# Patient Record
Sex: Female | Born: 1965 | Race: White | Hispanic: No
Health system: Midwestern US, Community
[De-identification: ages and names within clinical notes are randomized; demographics above are authoritative.]

## PROBLEM LIST (undated history)

## (undated) DIAGNOSIS — C801 Malignant (primary) neoplasm, unspecified: Secondary | ICD-10-CM

## (undated) DIAGNOSIS — G4730 Sleep apnea, unspecified: Secondary | ICD-10-CM

## (undated) DIAGNOSIS — I10 Essential (primary) hypertension: Secondary | ICD-10-CM

## (undated) DIAGNOSIS — R0600 Dyspnea, unspecified: Secondary | ICD-10-CM

## (undated) DIAGNOSIS — J189 Pneumonia, unspecified organism: Secondary | ICD-10-CM

## (undated) DIAGNOSIS — M1990 Unspecified osteoarthritis, unspecified site: Secondary | ICD-10-CM

## (undated) DIAGNOSIS — F419 Anxiety disorder, unspecified: Secondary | ICD-10-CM

## (undated) DIAGNOSIS — M25562 Pain in left knee: Secondary | ICD-10-CM

## (undated) DIAGNOSIS — Z96652 Presence of left artificial knee joint: Secondary | ICD-10-CM

## (undated) DIAGNOSIS — M1712 Unilateral primary osteoarthritis, left knee: Secondary | ICD-10-CM

## (undated) HISTORY — PX: THYROIDECTOMY: SHX17

## (undated) HISTORY — PX: ABDOMINAL HYSTERECTOMY: SHX81

## (undated) HISTORY — PX: KNEE SURGERY: SHX244

## (undated) HISTORY — PX: CHOLECYSTECTOMY: SHX55

## (undated) HISTORY — PX: REPLACEMENT TOTAL KNEE: SUR1224

---

## 2015-05-25 ENCOUNTER — Encounter: Admit: 2015-05-25

## 2015-05-25 ENCOUNTER — Inpatient Hospital Stay
Admit: 2015-05-25 | Discharge: 2015-05-25 | Disposition: A | Discharge status: Home / Self Care | Attending: Emergency Medicine

## 2015-05-25 DIAGNOSIS — R1010 Upper abdominal pain, unspecified: Secondary | ICD-10-CM

## 2015-05-25 LAB — PROTIME-INR
INR: 1
Protime: 10.9 s (ref 9.3–12.4)

## 2015-05-25 LAB — EKG 12-LEAD
Atrial Rate: 53 {beats}/min
P Axis: 48 degrees
P-R Interval: 136 ms
Q-T Interval: 448 ms
QRS Duration: 74 ms
QTc Calculation (Bazett): 420 ms
R Axis: 45 degrees
T Axis: 73 degrees
Ventricular Rate: 53 {beats}/min

## 2015-05-25 LAB — AMMONIA: Ammonia: 21 umol/L (ref 11.0–51.0)

## 2015-05-25 LAB — COMPREHENSIVE METABOLIC PANEL
ALT: 17 U/L (ref 0–32)
AST: 12 U/L (ref 0–31)
Albumin: 4.6 g/dL (ref 3.5–5.2)
Alkaline Phosphatase: 96 U/L (ref 35–104)
Anion Gap: 16 mmol/L (ref 7–16)
BUN: 20 mg/dL (ref 6–20)
CO2: 27 mmol/L (ref 22–29)
Calcium: 10.2 mg/dL (ref 8.6–10.2)
Chloride: 94 mmol/L — ABNORMAL LOW (ref 98–107)
Creatinine: 0.7 mg/dL (ref 0.5–1.0)
GFR African American: >60
GFR Non-African American: >60 mL/min/{1.73_m2} (ref >=60)
Glucose: 140 mg/dL — ABNORMAL HIGH (ref 74–109)
Potassium: 3.8 mmol/L (ref 3.5–5.0)
Sodium: 137 mmol/L (ref 132–146)
Total Bilirubin: <0.2 mg/dL (ref 0.0–1.2)
Total Protein: 7.7 g/dL (ref 6.4–8.3)

## 2015-05-25 LAB — URINALYSIS
Bilirubin Urine: NEGATIVE
Glucose, Ur: NEGATIVE mg/dL
Ketones, Urine: NEGATIVE mg/dL
Leukocyte Esterase, Urine: NEGATIVE
Nitrite, Urine: NEGATIVE
Protein, UA: NEGATIVE mg/dL
Specific Gravity, UA: 1.02 (ref 1.005–1.030)
Urobilinogen, Urine: 0.2 E.U./dL (ref <2.0)
pH, UA: 6 (ref 5.0–9.0)

## 2015-05-25 LAB — CBC WITH AUTO DIFFERENTIAL
Basophils %: 0 % (ref 0–2)
Basophils Absolute: 0.03 E9/L (ref 0.00–0.20)
Eosinophils %: 1 % (ref 0–6)
Eosinophils Absolute: 0.1 E9/L (ref 0.05–0.50)
Hematocrit: 41.8 % (ref 34.0–48.0)
Hemoglobin: 14 g/dL (ref 11.5–15.5)
Lymphocytes %: 18 % — ABNORMAL LOW (ref 20–42)
Lymphocytes Absolute: 2.26 E9/L (ref 1.50–4.00)
MCH: 27.7 pg (ref 26.0–35.0)
MCHC: 33.6 % (ref 32.0–34.5)
MCV: 82.4 fL (ref 80.0–99.9)
MPV: 7.7 fL (ref 7.0–12.0)
Monocytes %: 7 % (ref 2–12)
Monocytes Absolute: 0.9 E9/L (ref 0.10–0.95)
Neutrophils %: 74 % (ref 43–80)
Neutrophils Absolute: 9.48 E9/L — ABNORMAL HIGH (ref 1.80–7.30)
Platelets: 368 E9/L (ref 130–450)
RBC: 5.07 E12/L (ref 3.50–5.50)
RDW: 14.1 fL (ref 11.5–15.0)
WBC: 12.8 E9/L — ABNORMAL HIGH (ref 4.5–11.5)

## 2015-05-25 LAB — MICROSCOPIC URINALYSIS

## 2015-05-25 LAB — TROPONIN: Troponin: <0.01 ng/mL (ref 0.00–0.03)

## 2015-05-25 LAB — LIPASE: Lipase: 81 U/L — ABNORMAL HIGH (ref 13–60)

## 2015-05-25 MED ORDER — IOVERSOL 68 % IV SOLN
68 % | Freq: Once | INTRAVENOUS | Status: AC | PRN
Start: 2015-05-25 — End: 2015-05-25
  Administered 2015-05-25: 07:00:00 100 mL via INTRAVENOUS

## 2015-05-25 MED ORDER — ONDANSETRON 4 MG PO TBDP
4 MG | Freq: Once | ORAL | Status: AC
Start: 2015-05-25 — End: 2015-05-25
  Administered 2015-05-25: 06:00:00 4 mg via ORAL

## 2015-05-25 MED ORDER — SODIUM CHLORIDE 0.9 % IV BOLUS
0.9 % | Freq: Once | INTRAVENOUS | Status: AC
Start: 2015-05-25 — End: 2015-05-25
  Administered 2015-05-25: 06:00:00 1000 mL via INTRAVENOUS

## 2015-05-25 MED ORDER — PANTOPRAZOLE SODIUM 20 MG PO TBEC
20 MG | ORAL_TABLET | Freq: Every day | ORAL | Status: DC
Start: 2015-05-25 — End: 2018-01-28

## 2015-05-25 MED ORDER — HYDROMORPHONE HCL 1 MG/ML IJ SOLN
1 MG/ML | Freq: Once | INTRAMUSCULAR | Status: AC
Start: 2015-05-25 — End: 2015-05-25
  Administered 2015-05-25: 06:00:00 1 mg via INTRAVENOUS

## 2015-05-25 MED FILL — HYDROMORPHONE HCL 1 MG/ML IJ SOLN: 1 MG/ML | INTRAMUSCULAR | Qty: 1

## 2015-05-25 MED FILL — SODIUM CHLORIDE 0.9 % IV SOLN: 0.9 % | INTRAVENOUS | Qty: 1000

## 2015-05-25 MED FILL — ONDANSETRON 4 MG PO TBDP: 4 MG | ORAL | Qty: 1

## 2015-05-25 NOTE — ED Notes (Signed)
Patient in CT at this time.    Phylliss BlakesJames W Allexis Bordenave, LPN  16/09/9605/07/16 [id number]

## 2015-05-25 NOTE — ED Provider Notes (Signed)
??  Department of Emergency Medicine   ED  Provider Note  Admit Date/RoomTime: 05/25/2015 12:54 AM  ED Room: 16/16          History of Present Illness:  05/25/15, Time: 1:09 AM         Emma Pena is a 49 y.o. female presenting to the ED for abdominal pain, beginning earlier today.  The complaint has been constant, moderate in severity, and worsened by nothing. Pt states she has pain in her stomach. She states she has no pain after eating. Pt reports she is nauseated. Pt denies SOB, CP, fever, chills, vomiting, diarrhea, back pain, neck pain, melena, hematemesis, and urinary symptoms. Pt has hx of HTN and thyroid disease. She states she had thyroid surgery in the year 2000. She states she was informed that tests indicated she had thyroid cancer. She has FHx of DM and HTN. She reports she does not have hx of any other cancer, pancreatitis, or problems with gallbladder.    Review of Systems:   Pertinent positives and negatives are stated within HPI, all other systems reviewed and are negative.      --------------------------------------------- PAST HISTORY ---------------------------------------------  Past Medical History:  has a past medical history of Thyroid disease and Hypertension.    Past Surgical History:  has no past surgical history on file.    Social History:  reports that she has been smoking.  She does not have any smokeless tobacco history on file. She reports that she does not drink alcohol or use illicit drugs.    Family History: family history is not on file.     The patient???s home medications have been reviewed.    Allergies: Review of patient's allergies indicates no known allergies.      ---------------------------------------------------PHYSICAL EXAM--------------------------------------    Constitutional/General: Alert and oriented x3, well appearing, non toxic in NAD  Head: Normocephalic and atraumatic  Eyes: PERRL, EOMI, conjunctiva normal, sclera non icteric  Mouth: Oropharynx clear, handling  secretions, no trismus, no asymmetry of the posterior oropharynx or uvular edema  Neck: Supple, full ROM, non tender to palpation in the midline, no stridor, no crepitus, no meningeal signs  Respiratory: Lungs clear to auscultation bilaterally, no wheezes, rales, or rhonchi. Not in respiratory distress  Cardiovascular:  Regular rate. Regular rhythm. No murmurs, gallops, or rubs. 2+ distal pulses  Chest: No chest wall tenderness  GI:  Abdomen Soft. Epigastric and upper quadrant pain, Non distended.  +BS.   No rebound, guarding, or rigidity. No pulsatile masses.  Musculoskeletal: Moves all extremities x 4. Warm and well perfused, no clubbing, cyanosis, or edema. Capillary refill <3 seconds  Integument: skin warm and dry. No rashes.   Lymphatic: no lymphadenopathy noted  Neurologic: GCS 15, no focal deficits, symmetric strength 5/5 in the upper and lower extremities bilaterally  Psychiatric: Normal Affect      -------------------------------------------------- RESULTS -------------------------------------------------  I have personally reviewed all laboratory and imaging results for this patient. Results are listed below.     LABS:  Results for orders placed or performed during the hospital encounter of 05/25/15   Comprehensive Metabolic Panel   Result Value Ref Range    Sodium 137 132 - 146 mmol/L    Potassium 3.8 3.5 - 5.0 mmol/L    Chloride 94 (L) 98 - 107 mmol/L    CO2 27 22 - 29 mmol/L    Anion Gap 16 7 - 16 mmol/L    Glucose 140 (H) 74 - 109 mg/dL  BUN 20 6 - 20 mg/dL    CREATININE 0.7 0.5 - 1.0 mg/dL    GFR Non-African American >60 >=60 mL/min/1.73    GFR African American >60     Calcium 10.2 8.6 - 10.2 mg/dL    Total Protein 7.7 6.4 - 8.3 g/dL    Alb 4.6 3.5 - 5.2 g/dL    Total Bilirubin <1.6 0.0 - 1.2 mg/dL    Alkaline Phosphatase 96 35 - 104 U/L    ALT 17 0 - 32 U/L    AST 12 0 - 31 U/L   CBC Auto Differential   Result Value Ref Range    WBC 12.8 (H) 4.5 - 11.5 E9/L    RBC 5.07 3.50 - 5.50 E12/L     Hemoglobin 14.0 11.5 - 15.5 g/dL    Hematocrit 10.9 60.4 - 48.0 %    MCV 82.4 80.0 - 99.9 fL    MCH 27.7 26.0 - 35.0 pg    MCHC 33.6 32.0 - 34.5 %    RDW 14.1 11.5 - 15.0 fL    Platelets 368 130 - 450 E9/L    MPV 7.7 7.0 - 12.0 fL    Neutrophils Relative 74 43 - 80 %    Lymphocytes Relative 18 (L) 20 - 42 %    Monocytes Relative 7 2 - 12 %    Eosinophils Relative Percent 1 0 - 6 %    Basophils Relative 0 0 - 2 %    Neutrophils Absolute 9.48 (H) 1.80 - 7.30 E9/L    Lymphocytes Absolute 2.26 1.50 - 4.00 E9/L    Monocytes Absolute 0.90 0.10 - 0.95 E9/L    Eosinophils Absolute 0.10 0.05 - 0.50 E9/L    Basophils Absolute 0.03 0.00 - 0.20 E9/L   Lipase   Result Value Ref Range    Lipase 81 (H) 13 - 60 U/L   Troponin   Result Value Ref Range    Troponin <0.01 0.00 - 0.03 ng/mL   Urinalysis   Result Value Ref Range    Color, UA Yellow Straw/Yellow    Clarity, UA Clear Clear    Glucose, Ur Negative Negative mg/dL    Bilirubin Urine Negative Negative    Ketones, Urine Negative Negative mg/dL    Specific Gravity, UA 1.020 1.005 - 1.030    Blood, Urine TRACE (A) Negative    pH, UA 6.0 5.0 - 9.0    Protein, UA Negative Negative mg/dL    Urobilinogen, Urine 0.2 < 2.0 E.U./dL    Nitrite, Urine Negative Negative    Leukocyte Esterase, Urine Negative Negative   Protime-INR   Result Value Ref Range    Protime 10.9 9.3 - 12.4 sec    INR 1.0    Ammonia   Result Value Ref Range    Ammonia 21.0 11.0 - 51.0 umol/L   Microscopic Urinalysis   Result Value Ref Range    WBC, UA 0-1 0 - 5 /HPF    RBC, UA 0-1 0 - 2 /HPF    Bacteria, UA NONE /HPF   EKG 12 Lead   Result Value Ref Range    Ventricular Rate 53 BPM    Atrial Rate 53 BPM    P-R Interval 136 ms    QRS Duration 74 ms    Q-T Interval 448 ms    QTc Calculation (Bazett) 420 ms    P Axis 48 degrees    R Axis 45 degrees    T Axis 73 degrees  RADIOLOGY:  Interpreted by Radiologist.  RADIOLOGY REPORT   Final Result      CT ABDOMEN PELVIS W IV CONTRAST Additional Contrast?: None   Final  Result   IMPRESSION:   1. No evidence of urinary or bowel obstruction.   2. Splenic lesion could be a cyst. Ultrasound would confirm.   3. Large pelvic mass. It is uncertain as to whether this mass is of   gynecologic or GI origin. Surgical evaluation recommended.   4. Probable cyst in the left ovary.      THIS IS AN ABNORMAL REPORT.  PLEASE TAKE APPROPRIATE MEASURES.                EKG:  This EKG is signed and interpreted by the EP.    Time: 1:21am  Rate: 53  Rhythm: sinus bradycardia  Interpretation: otherwise normal   Comparison: none      ------------------------- NURSING NOTES AND VITALS REVIEWED ---------------------------   The nursing notes within the ED encounter and vital signs as below have been reviewed by myself.  BP 124/62 mmHg   Pulse 60   Temp(Src) 98.1 ??F (36.7 ??C) (Oral)   Resp 18   Ht  (1.676 m)   Wt 200 lb (90.719 kg)   BMI 32.30 kg/m2   SpO2 92%   LMP 05/25/2015  Oxygen Saturation Interpretation: Normal    The patient???s available past medical records and past encounters were reviewed.        ------------------------------ ED COURSE/MEDICAL DECISION MAKING----------------------  Medications   0.9 % sodium chloride bolus (0 mLs Intravenous Stopped 05/25/15 0419)   ondansetron (ZOFRAN-ODT) disintegrating tablet 4 mg (4 mg Oral Given 05/25/15 0153)   HYDROmorphone (DILAUDID) injection 1 mg (1 mg Intravenous Given 05/25/15 0153)   ioversol (OPTIRAY) 68 % injection 100 mL (100 mLs Intravenous Given 05/25/15 0309)       Medical Decision Making:    PATIENT PRESENTED TO THE ED WITH ABDOMINAL PAIN  CT SHOWED POSSIBLE PELVIS MASS  POSSIBLE FIBROID PATIENT FELT BETTER AFTER TX. CONSIDER CHOLELITHIASIS  ULCER PCP FOLLOW UP  WITH REFERAL TO OBGYN  AND GENERAL SURGERY  This patient's ED course included: a personal history and physicial examination and IV medications    This patient has remained hemodynamically stable during their ED course.    Re-Evaluations:           Re-evaluation.  Patient???s symptoms are  improving      Counseling:   The emergency provider has spoken with the patient and discussed today???s results, in addition to providing specific details for the plan of care and counseling regarding the diagnosis and prognosis.  Questions are answered at this time and they are agreeable with the plan.       --------------------------------- IMPRESSION AND DISPOSITION ---------------------------------    IMPRESSION  1. Pain of upper abdomen    2. Intramural leiomyoma of uterus    3. Calculus of gallbladder with acute cholecystitis without obstruction        DISPOSITION  Disposition: Discharge to home  Patient condition is good      05/25/15, 1:09 AM.    This note is prepared by Harrie Jeans, acting as Scribe for Delila Spence, DO.    Delila Spence, DO:  The scribe's documentation has been prepared under my direction and personally reviewed by me in its entirety.  I confirm that the note above accurately reflects all work, treatment, procedures, and medical decision making performed by me.  Delila SpenceJohn D Mahlet Jergens, DO  05/25/15 1835

## 2015-05-26 LAB — CULTURE, URINE

## 2016-01-04 ENCOUNTER — Inpatient Hospital Stay: Attending: Rehabilitative and Restorative Service Providers"

## 2016-01-04 NOTE — Progress Notes (Signed)
[facility]  PHYSICAL THERAPY INITIAL EVALUATION      Date: 01/04/2016  Patient Name: Emma Pena  DOB: 1966/02/24   MRN: [id number]  Physician: Waldemar Dickens  DOInjury: insidious onset 09/2014  DOSx: none  Diagnosis: R peroneal tendonitis    SUBJECTIVE:  Emma Pena reports developing pain on the lateral side of her R foot. Prior history of related problems: no prior problems with this area in the past. Currently reports no pain 0/10. States she received an injection of cortisone 1 week ago which has completely relieved her pain.      Aggravated by: nothing    Relieved by: nothing    Patient Goals: remain pain free    Contraindications/ Precautions: none    Past Medical History:       Diagnosis Date   ??? Anxiety    ??? Hypertension    ??? Thyroid disease    ;      Procedure Laterality Date   ??? Hysterectomy     ??? Cholecystectomy     ??? Thyroid surgery  2000         Medications:   Current Outpatient Rx   Medication Sig Dispense Refill   ??? terbinafine (LAMISIL) 250 MG tablet Take 250 mg by mouth daily     ??? metoprolol (TOPROL-XL) 100 MG XL tablet Take 100 mg by mouth daily     ??? citalopram (CELEXA) 40 MG tablet Take 40 mg by mouth daily     ??? hydrochlorothiazide (HYDRODIURIL) 12.5 MG tablet Take 12.5 mg by mouth daily     ??? thyroid (ARMOUR) 15 MG tablet Take 15 mg by mouth daily     ??? amLODIPine (NORVASC) 10 MG tablet Take 10 mg by mouth daily     ??? pantoprazole (PROTONIX) 20 MG tablet Take 1 tablet by mouth daily 30 tablet 0        OBJECTIVE:  Observation/Appearance: well nourished    ROM: normal and equal bilaterally    Strength: 5/5    Palpation: no tenderness    Sensation: intact to light touch bilaterally    Special Tests:  Anterior Drawer Right negative  Lateral Stability Right negative  Medial Stability Right negative    ASSESSMENT  Does not require phyiscal therapy at this time      Thank you for the opportunity to work with your patient.  If you have questions or comments, please contact  me at [phone]; fax: [phone].    Sharen Heck, PT

## 2017-05-27 ENCOUNTER — Encounter (HOSPITAL_COMMUNITY): Payer: Self-pay

## 2017-05-27 ENCOUNTER — Emergency Department (HOSPITAL_COMMUNITY)
Admission: EM | Admit: 2017-05-27 | Discharge: 2017-05-27 | Disposition: A | Discharge status: Home / Self Care | Payer: Self-pay | Attending: Emergency Medicine | Admitting: Emergency Medicine

## 2017-05-27 DIAGNOSIS — R21 Rash and other nonspecific skin eruption: Secondary | ICD-10-CM | POA: Insufficient documentation

## 2017-05-27 DIAGNOSIS — I10 Essential (primary) hypertension: Secondary | ICD-10-CM | POA: Insufficient documentation

## 2017-05-27 DIAGNOSIS — Y929 Unspecified place or not applicable: Secondary | ICD-10-CM | POA: Insufficient documentation

## 2017-05-27 DIAGNOSIS — Z79899 Other long term (current) drug therapy: Secondary | ICD-10-CM | POA: Insufficient documentation

## 2017-05-27 DIAGNOSIS — T63441A Toxic effect of venom of bees, accidental (unintentional), initial encounter: Secondary | ICD-10-CM

## 2017-05-27 DIAGNOSIS — Y939 Activity, unspecified: Secondary | ICD-10-CM | POA: Insufficient documentation

## 2017-05-27 DIAGNOSIS — F17200 Nicotine dependence, unspecified, uncomplicated: Secondary | ICD-10-CM | POA: Insufficient documentation

## 2017-05-27 DIAGNOSIS — Y999 Unspecified external cause status: Secondary | ICD-10-CM | POA: Insufficient documentation

## 2017-05-27 HISTORY — DX: Essential (primary) hypertension: I10

## 2017-05-27 MED ORDER — EPINEPHRINE 0.3 MG/0.3ML IJ SOAJ: 0.3000 mg | Freq: Once | INTRAMUSCULAR | 1 refills | Status: AC | PRN

## 2017-05-27 MED ORDER — FAMOTIDINE IN NACL 20-0.9 MG/50ML-% IV SOLN
20.0000 mg | Freq: Once | INTRAVENOUS | Status: AC
  Administered 2017-05-27: 20 mg via INTRAVENOUS
  Filled 2017-05-27: qty 50

## 2017-05-27 MED ORDER — PREDNISONE 20 MG PO TABS: 40.0000 mg | ORAL_TABLET | Freq: Every day | ORAL | 0 refills | Status: DC

## 2017-05-27 MED ORDER — DIPHENHYDRAMINE HCL 25 MG PO TABS: 25.0000 mg | ORAL_TABLET | Freq: Four times a day (QID) | ORAL | 0 refills | Status: DC

## 2017-05-27 MED ORDER — METHYLPREDNISOLONE SODIUM SUCC 125 MG IJ SOLR
125.0000 mg | Freq: Once | INTRAMUSCULAR | Status: AC
  Administered 2017-05-27: 125 mg via INTRAVENOUS
  Filled 2017-05-27: qty 2

## 2017-05-27 MED ORDER — DIPHENHYDRAMINE HCL 25 MG PO CAPS
25.0000 mg | ORAL_CAPSULE | Freq: Once | ORAL | Status: AC
  Administered 2017-05-27: 25 mg via ORAL
  Filled 2017-05-27: qty 1

## 2017-05-27 MED ORDER — DIPHENHYDRAMINE HCL 50 MG/ML IJ SOLN
25.0000 mg | Freq: Once | INTRAMUSCULAR | Status: AC
  Administered 2017-05-27: 25 mg via INTRAVENOUS
  Filled 2017-05-27: qty 1

## 2017-05-27 NOTE — ED Provider Notes (Signed)
[facility] DEPT Provider Note   CSN: [id number] Arrival date & time: 05/27/17  1737     History   Chief Complaint Chief Complaint  Patient presents with  . allergic reaction-bee sting    HPI Paula Brown is a 51 y.o. female.  Paula Brown is a 51 y.o. Female who presents to the ED with complaint of allergic reaction from bee sting. Patient reports she was stung by a bee about 30 mins prior to arrival. She was stung at her right wrist. She reports afterwards she turned very red, felt itchy, and felt like she could not breathe. She rushed to the emergency department and "felt like I would not make it here." She reports after sitting down and being triaged she began to feel better. She denies any current shortness of breath. She tells me she feels like her lips are swollen and she is red all over. She denies any tongue or throat swelling. She denies any trouble breathing currently. She's had nothing for treatment of her symptoms. She denies history of anaphylaxis. No epinephrine. She denies other complaints.   The history is provided by the patient and medical records. No language interpreter was used.    Past Medical History:  Diagnosis Date  . Hypertension     There are no active problems to display for this patient.   History reviewed. No pertinent surgical history.  OB History    No data available       Home Medications    Prior to Admission medications   Medication Sig Start Date End Date Taking? Authorizing Provider  amLODipine (NORVASC) 10 MG tablet Take 10 mg by mouth daily.   Yes [provider]  citalopram (CELEXA) 40 MG tablet Take 40 mg by mouth daily.   Yes [provider]  hydrochlorothiazide (MICROZIDE) 12.5 MG capsule Take 12.5 mg by mouth daily.   Yes [provider]  HYDROcodone-acetaminophen (NORCO) 10-325 MG tablet Take 1 tablet by mouth every 6 (six) hours as needed for moderate pain.   Yes [provider]    metoprolol succinate (TOPROL-XL) 100 MG 24 hr tablet Take 100 mg by mouth daily. Take with or immediately following a meal.   Yes [provider]  omeprazole (PRILOSEC) 20 MG capsule Take 20 mg by mouth daily.   Yes [provider]  thyroid (ARMOUR) 15 MG tablet Take 15 mg by mouth daily.   Yes [provider]  diphenhydrAMINE (BENADRYL) 25 MG tablet Take 1 tablet (25 mg total) by mouth every 6 (six) hours. 05/27/17   Waynetta Pean, PA-C  EPINEPHrine (EPIPEN 2-PAK) 0.3 mg/0.3 mL IJ SOAJ injection Inject 0.3 mLs (0.3 mg total) into the muscle once as needed (for severe allergic reaction). CAll 911 immediately if you have to use this medicine 05/27/17   Waynetta Pean, PA-C  predniSONE (DELTASONE) 20 MG tablet Take 2 tablets (40 mg total) by mouth daily. 05/27/17   Waynetta Pean, PA-C    Family History No family history on file.  Social History Social History  Substance Use Topics  . Smoking status: Current Every Day Smoker  . Smokeless tobacco: Never Used  . Alcohol use Not on file     Allergies   Bee venom   Review of Systems Review of Systems  Constitutional: Negative for chills and fever.  HENT: Negative for congestion, facial swelling, sore throat and trouble swallowing.   Eyes: Negative for visual disturbance.  Respiratory: Positive for shortness of breath (Resolved.). Negative for cough  and wheezing.   Cardiovascular: Negative for chest pain and palpitations.  Gastrointestinal: Negative for abdominal pain, nausea and vomiting.  Genitourinary: Negative for dysuria.  Musculoskeletal: Negative for back pain and neck pain.  Skin: Positive for rash.  Neurological: Negative for syncope, light-headedness and headaches.     Physical Exam Updated Vital Signs BP (!) 142/74   Pulse (!) 56   Temp 98.3 F (36.8 C) (Oral)   Resp 15   SpO2 98%   Physical Exam  Constitutional: She is oriented to person, place, and time. She appears well-developed  and well-nourished. No distress.  Nontoxic appearing.  HENT:  Head: Normocephalic and atraumatic.  Right Ear: External ear normal.  Left Ear: External ear normal.  Mouth/Throat: Oropharynx is clear and moist.  Throat is clear. No tongue or lip swelling. No stridor. Uvula is midline without edema.  Eyes: Conjunctivae are normal. Pupils are equal, round, and reactive to light. Right eye exhibits no discharge. Left eye exhibits no discharge.  Neck: Normal range of motion. Neck supple. No JVD present.  Cardiovascular: Normal rate, regular rhythm, normal heart sounds and intact distal pulses.   Pulmonary/Chest: Effort normal and breath sounds normal. No stridor. No respiratory distress. She has no wheezes. She has no rales.  Lungs are clear to ascultation bilaterally. Symmetric chest expansion bilaterally. No increased work of breathing. No rales or rhonchi.    Abdominal: Soft. There is no tenderness.  Musculoskeletal: Normal range of motion. She exhibits no edema or tenderness.  Lymphadenopathy:    She has no cervical adenopathy.  Neurological: She is alert and oriented to person, place, and time. Coordination normal.  Skin: Skin is warm and dry. Capillary refill takes less than 2 seconds. Rash noted. She is not diaphoretic. There is erythema. No pallor.  Erythroderma. No hives. No other rashes. Small area of edema noted to right wrist where she was stung by bee. No retained stinger.   Psychiatric: She has a normal mood and affect. Her behavior is normal.  Nursing note and vitals reviewed.    ED Treatments / Results  Labs (all labs ordered are listed, but only abnormal results are displayed) Labs Reviewed - No data to display  EKG  EKG Interpretation None       Radiology No results found.  Procedures Procedures (including critical care time)  Medications Ordered in ED Medications  methylPREDNISolone sodium succinate (SOLU-MEDROL) 125 mg/2 mL injection 125 mg (125 mg  Intravenous Given 05/27/17 1833)  diphenhydrAMINE (BENADRYL) injection 25 mg (25 mg Intravenous Given 05/27/17 1833)  diphenhydrAMINE (BENADRYL) capsule 25 mg (25 mg Oral Given 05/27/17 1833)  famotidine (PEPCID) IVPB 20 mg premix (0 mg Intravenous Stopped 05/27/17 1936)     Initial Impression / Assessment and Plan / ED Course  I have reviewed the triage vital signs and the nursing notes.  Pertinent labs & imaging results that were available during my care of the patient were reviewed by me and considered in my medical decision making (see chart for details).    This  is a 51 y.o. Female who presents to the ED with complaint of allergic reaction from bee sting. Patient reports she was stung by a bee about 30 mins prior to arrival. She was stung at her right wrist. She reports afterwards she turned very red, felt itchy, and felt like she could not breathe. She rushed to the emergency department and "felt like I would not make it here." She reports after sitting down and being triaged she  began to feel better. She denies any current shortness of breath. She tells me she feels like her lips are swollen and she is red all over. She denies any tongue or throat swelling. She denies any trouble breathing currently. She's had nothing for treatment of her symptoms. She denies history of anaphylaxis. On exam the patient is afebrile nontoxic appearing. She has no difficulty breathing. No stridor. Lungs are clear to auscultation bilaterally. No tongue or lip swelling. She has diffuse erythema to her skin. No evidence of any hives. No nausea or vomiting. Will provide with Solu-Medrol, Benadryl and Pepcid and observed for 2 hours. At 1 hour recheck patient reports feeling much better. She has no redness of her skin. No trouble breathing or swallowing. Will recheck in one hour. 2 hour post medication recheck patient reports feeling much better. She is then observed for about 3 hours now. She has no skin redness. Note  trouble swallowing or breathing. She feels ready for discharge. We'll discharge at this time with prescriptions for prednisone, Benadryl and an EpiPen. I discussed when to use and how to use an EpiPen with the patient. I advised that she must dial 911 if she uses the EpiPen. I encouraged her to follow-up closely with her primary care provider. I advised the patient to follow-up with their primary care provider this week. I advised the patient to return to the emergency department with new or worsening symptoms or new concerns. The patient verbalized understanding and agreement with plan.      Final Clinical Impressions(s) / ED Diagnoses   Final diagnoses:  Allergic reaction to bee sting  Bee sting, accidental or unintentional, initial encounter    New Prescriptions New Prescriptions   DIPHENHYDRAMINE (BENADRYL) 25 MG TABLET    Take 1 tablet (25 mg total) by mouth every 6 (six) hours.   EPINEPHRINE (EPIPEN 2-PAK) 0.3 MG/0.3 ML IJ SOAJ INJECTION    Inject 0.3 mLs (0.3 mg total) into the muscle once as needed (for severe allergic reaction). CAll 911 immediately if you have to use this medicine   PREDNISONE (DELTASONE) 20 MG TABLET    Take 2 tablets (40 mg total) by mouth daily.     Waynetta Pean, PA-C 05/27/17 2053    Quintella Reichert, MD 05/31/17 [phone]

## 2017-05-27 NOTE — ED Triage Notes (Signed)
Patient here following be sting 30 minutes pta. Now complaining of shortness of breath, hoarseness noted. Has never had reaction before. Reports near syncope with same. Alert and oriented

## 2017-10-02 ENCOUNTER — Ambulatory Visit: Admit: 2017-10-02 | Discharge: 2017-10-02 | Attending: Orthopaedic Surgery

## 2017-10-02 DIAGNOSIS — S83242A Other tear of medial meniscus, current injury, left knee, initial encounter: Secondary | ICD-10-CM

## 2017-10-02 MED ORDER — TRIAMCINOLONE ACETONIDE 40 MG/ML IJ SUSP
40 MG/ML | Freq: Once | INTRAMUSCULAR | Status: AC
Start: 2017-10-02 — End: 2017-10-02
  Administered 2017-10-02: 16:00:00 40 mg via INTRA_ARTICULAR

## 2017-10-02 NOTE — Progress Notes (Signed)
Chief Complaint   Patient presents with   . Knee Pain     Left knee pain form a fall last december. Patient fell down about 8 steps.        Subjective:     Patient ID: Emma Pena is a 51 y.o.. Caucasian female    Knee Pain  Patient complains of left knee pain. This is evaluated as a personal injury. There was not a history of injury.    The pain began 10 months ago. The pain is located medial. She describes  Her symptoms as aching. She has not experienced popping, clicking, locking, and giving way in the affected knee.  The patient has had pain with kneeling, squating, and climbing stairs.  Symptoms improve with rest. The symptoms are worse with activity, stair climbing, kneeling, deep knee bending. The knee has not given out or felt unstable. The patient cannot bend and straighten the knee fully.  The patient is active in none. Treatment to date has been ice, heat, Tylenol, NSAID's, without significant relief. The patient is working. 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     Past Medical History:   Diagnosis Date   . Anxiety    . Hypertension    . Thyroid disease      Past Surgical History:   Procedure Laterality Date   . CHOLECYSTECTOMY     . HYSTERECTOMY     . THYROID SURGERY  2000       Current Outpatient Prescriptions:   .  HYDROcodone-acetaminophen (NORCO) 10-325 MG per tablet, Take 1 tablet by mouth every 6 hours as needed for Pain.., Disp: , Rfl:   .  terbinafine (LAMISIL) 250 MG tablet, Take 250 mg by mouth daily, Disp: , Rfl:   .  metoprolol (TOPROL-XL) 100 MG XL tablet, Take 100 mg by mouth daily, Disp: , Rfl:   .  citalopram (CELEXA) 40 MG tablet, Take 40 mg by mouth daily, Disp: , Rfl:   .  hydrochlorothiazide (HYDRODIURIL) 12.5 MG tablet, Take 12.5 mg by mouth daily, Disp: , Rfl:   .  thyroid (ARMOUR) 15 MG tablet, Take 15 mg by mouth daily, Disp: , Rfl:   .  amLODIPine (NORVASC) 10 MG tablet, Take 10 mg by mouth daily, Disp: , Rfl:   .  pantoprazole (PROTONIX) 20 MG tablet, Take 1 tablet by  mouth daily, Disp: 30 tablet, Rfl: 0  Allergies   Allergen Reactions   . Bee Venom Anaphylaxis     Social History     Social History   . Marital status: Single     Spouse name: N/A   . Number of children: N/A   . Years of education: N/A     Occupational History   . Not on file.     Social History Main Topics   . Smoking status: Current Every Day Smoker   . Smokeless tobacco: Not on file   . Alcohol use No   . Drug use: No   . Sexual activity: No     Other Topics Concern   . Not on file     Social History Narrative   . No narrative on file     History reviewed. No pertinent family history.      REVIEW OF SYSTEMS:     General/Constitution:  (-)weight loss, (-)fever, (-)chills, (-)weakness.   Skin: (-) rash,(-) psoriasis,(-) eczema, (-)skin cancer.   Musculoskeletal: (-) fractures,  (-) dislocations,(-) collagen vascular disease, (-) fibromyalgia, (-) multiple sclerosis, (-) muscular  dystrophy, (-) RSD,(-) joint pain (-)swelling, (-) joint pain,swelling.  Neurologic: (-) epilepsy, (-)seizures,(-) brain tumor,(-) TIA, (-)stroke, (-)headaches, (-)Parkinson disease,(-) memory loss, (-) LOC.  Cardiovascular: (-) Chest pain, (-) swelling in legs/feet, (-) SOB, (-) cramping in legs/feet with walking.  Respiratory: (-) SOB, (-) Coughing, (-) night sweats.  GI: (-) nausea, (-) vomiting, (-) diarrhea, (-) blood in stool, (-) gastric ulcer.  Psychiatric: (-) Depression, (-) Anxiety, (-) bipolar disease, (-) Alzheimer's Disease  Allergic/Immunologic: (-) allergies latex, (-) allergies metal, (-) skin sensitivity.  Hematlogic: (-) anemia, (-) blood transfusion, (-) DVT/PE, (-) Clotting disorders      Subjective:    Constitution:  Ht 5\' 6"  (1.676 m)   Wt 200 lb (90.7 kg)   BMI 32.28 kg/m       Psycihatric:  The patient is alert and oriented x 3, appears to be stated age and in no distress.      Respiratory:  Respiratory effort is not labored.  Patient is not gasping.  Palpation of the chest reveals no tactile  fremitus.    Skin:  Upon inspection: the skin appears warm, dry and intact.  There is  a previous scar over the affected area.There is any cellulitis, lymphedema or cutaneous lesions noted in the lower extremities.   Upon palpation there is no induration noted.      Neurologic:  Gait: antalgic;  Motor exam of the lower extremities show ; quadriceps, hamstrings, foot dorsi and plantar flexors intact R.  5/5 and L. 5/5. Deep tendon reflexes are 2/4 at the knees and 2/4 at the ankles with strong extensor hallicus longus motor strength bilaterally. Sensory to both feet is intact to all sensory roots.    Cardiovascular:  The vascular exam is normal and is well perfused to distal extremities.  Distal pulses DP/PT: R. 2+; L. 2+.  There is cap refill noted less than two seconds in all digits. There is not edema of the bilateral lower extremities.  There is not varicosities noted in the distal extremities.      Lymph:  Upon palpation,  there is no lymphadenopathy noted in bilateral lower extremities.      Musculoskeletal:  Gait: antalgic; examination of the nails and digits reveal no cyanosis or clubbing.    Lumbar exam:  On visual inspection, there is not deformity of the spine.   full range of motion, no tenderness, palpable spasm or pain on motion. Special tests: Straight Leg Raise negative, Faber test negative.      Hip exam:   Upon inspection, there is not deformity noted.  Upon palpation there is not tenderness.  ROM: is  full and symmetrical.   Strength: Hip Flexors 5/5; Hip Abductors 5/5; Hip Adduction 5/5.     Knee exam:  Left knee exam shows;  range of motion of R. Knee is 0 to 120, and L. Knee is 0 to 115. The patient does have  pain on motion, effusion is mild, there is tenderness over the  medial region, there are not any masses, there is not ligamentous instability, there is not  deformity noted.    Knee exam: left positive for moderate crepitations, some mild tenderness laxity is not noted with  stress.  There  is not a popliteal cyst.    R. Knee:  Lachman's negative, Anterior Drawer negative, Posterior Drawer negative  McMurray's negative, Thallasy  negative,   PF grind test negative, Apprehension test negative, Patellar J sign  negative  L. Knee:  Lachman's negative, Anterior  Drawer negative, Posterior Drawer negative  McMurray's positive, Thallasy  positive,   PF grind test negative, Apprehension test negative,  Patellar J sign  negative    MRI:  Medial meniscus tear, moderate-severe medial joint narrowing  Radiographic findings reviewed with patient    Assessment:  Encounter Diagnoses   Name Primary?   . Acute medial meniscus tear of left knee, initial encounter Yes   . Primary osteoarthritis of left knee        Plan:  Natural history and expected course discussed. Questions answered.  Transport planner distributed.  Rest, ice, compression, and elevation (RICE) therapy.  Patellar compression sleeve.  I had a lengthy discussion with the patient regarding their diagnosis. I explained treatment options including surgical vs non surgical treatment. I reviewed in detail the risks and benefits and outlined the procedure in detail with expected outcomes and possible complications.  I also discussed non surgical treatment such as injections (CSI and visco supplementation), physical therapy, topical creams and NSAID's. They have elected for conservative management at this time.   The skin was prepped with alcohol and betadine. A 60 ml syringe with a 18 gauge needle was inserted into the on left knee joint space.  I retained 5ml of fluid.   I will proceed with a cortisone injection in the Left knee.  Verbal and written consent was obtained for the injections. The skin was prepped with alcohol. 1mL of Kenalog 40mg  and 9mL of 0.25% Marcaine was  injected to Left knee. The injection was given through the lateral side of the knee. The patient tolerated the injection well. I will see the patient back prn

## 2017-11-14 ENCOUNTER — Encounter

## 2017-11-15 ENCOUNTER — Ambulatory Visit: Admit: 2017-11-15 | Discharge: 2017-11-15

## 2017-11-15 ENCOUNTER — Ambulatory Visit: Admit: 2017-11-15 | Discharge: 2017-11-15 | Attending: Orthopaedic Surgery

## 2017-11-15 DIAGNOSIS — M25562 Pain in left knee: Secondary | ICD-10-CM

## 2017-11-15 DIAGNOSIS — S83242A Other tear of medial meniscus, current injury, left knee, initial encounter: Secondary | ICD-10-CM

## 2017-11-15 NOTE — Progress Notes (Signed)
Chief Complaint   Patient presents with   . Knee Pain     Left Knee, F/U, had aspiration and cortisone injection with good results.       Subjective:     Patient ID: Emma Pena is a 51 y.o.. Caucasian female    Knee Pain  Patient complains of left knee pain. This is evaluated as a personal injury. There was not a history of injury.    The pain began 10 + months ago. The pain is located medial. She describes  Her symptoms as aching. She has not experienced popping, clicking, locking, and giving way in the affected knee.  The patient does not have pain with kneeling, squating, and climbing stairs.  Symptoms improve with rest. The symptoms are worse with activity, stair climbing, kneeling, deep knee bending. The knee has not given out or felt unstable. The patient can bend and straighten the knee fully.  The patient is active in none. Treatment to date has been ice, heat, Tylenol, NSAID's, aspiration and CSI  with significant relief. The patient is working. 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 She had CSI and aspiration at last visit with good results.    Past Medical History:   Diagnosis Date   . Anxiety    . Hypertension    . Thyroid disease      Past Surgical History:   Procedure Laterality Date   . CHOLECYSTECTOMY     . HYSTERECTOMY     . THYROID SURGERY  2000       Current Outpatient Prescriptions:   .  HYDROcodone-acetaminophen (NORCO) 10-325 MG per tablet, Take 1 tablet by mouth every 6 hours as needed for Pain.., Disp: , Rfl:   .  terbinafine (LAMISIL) 250 MG tablet, Take 250 mg by mouth daily, Disp: , Rfl:   .  metoprolol (TOPROL-XL) 100 MG XL tablet, Take 100 mg by mouth daily, Disp: , Rfl:   .  citalopram (CELEXA) 40 MG tablet, Take 40 mg by mouth daily, Disp: , Rfl:   .  hydrochlorothiazide (HYDRODIURIL) 12.5 MG tablet, Take 12.5 mg by mouth daily, Disp: , Rfl:   .  thyroid (ARMOUR) 15 MG tablet, Take 15 mg by mouth daily, Disp: , Rfl:   .  amLODIPine (NORVASC) 10 MG tablet, Take 10 mg by mouth  daily, Disp: , Rfl:   .  pantoprazole (PROTONIX) 20 MG tablet, Take 1 tablet by mouth daily, Disp: 30 tablet, Rfl: 0  Allergies   Allergen Reactions   . Bee Venom Anaphylaxis     Social History     Social History   . Marital status: Single     Spouse name: N/A   . Number of children: N/A   . Years of education: N/A     Occupational History   . Not on file.     Social History Main Topics   . Smoking status: Current Every Day Smoker   . Smokeless tobacco: Not on file   . Alcohol use No   . Drug use: No   . Sexual activity: No     Other Topics Concern   . Not on file     Social History Narrative   . No narrative on file     No family history on file.      REVIEW OF SYSTEMS:     General/Constitution:  (-)weight loss, (-)fever, (-)chills, (-)weakness.   Skin: (-) rash,(-) psoriasis,(-) eczema, (-)skin cancer.   Musculoskeletal: (-) fractures,  (-)  dislocations,(-) collagen vascular disease, (-) fibromyalgia, (-) multiple sclerosis, (-) muscular dystrophy, (-) RSD,(-) joint pain (-)swelling, (-) joint pain,swelling.  Neurologic: (-) epilepsy, (-)seizures,(-) brain tumor,(-) TIA, (-)stroke, (-)headaches, (-)Parkinson disease,(-) memory loss, (-) LOC.  Cardiovascular: (-) Chest pain, (-) swelling in legs/feet, (-) SOB, (-) cramping in legs/feet with walking.  Respiratory: (-) SOB, (-) Coughing, (-) night sweats.  GI: (-) nausea, (-) vomiting, (-) diarrhea, (-) blood in stool, (-) gastric ulcer.  Psychiatric: (-) Depression, (-) Anxiety, (-) bipolar disease, (-) Alzheimer's Disease  Allergic/Immunologic: (-) allergies latex, (-) allergies metal, (-) skin sensitivity.  Hematlogic: (-) anemia, (-) blood transfusion, (-) DVT/PE, (-) Clotting disorders      Subjective:    Constitution:  Temp 98 F (36.7 C)   Ht 5\' 6"  (1.676 m)   Wt 200 lb (90.7 kg)   BMI 32.28 kg/m       Psycihatric:  The patient is alert and oriented x 3, appears to be stated age and in no distress.      Respiratory:  Respiratory effort is not labored.   Patient is not gasping.  Palpation of the chest reveals no tactile fremitus.    Skin:  Upon inspection: the skin appears warm, dry and intact.  There is  a previous scar over the affected area.There is any cellulitis, lymphedema or cutaneous lesions noted in the lower extremities.   Upon palpation there is no induration noted.      Neurologic:  Gait: antalgic;  Motor exam of the lower extremities show ; quadriceps, hamstrings, foot dorsi and plantar flexors intact R.  5/5 and L. 5/5. Deep tendon reflexes are 2/4 at the knees and 2/4 at the ankles with strong extensor hallicus longus motor strength bilaterally. Sensory to both feet is intact to all sensory roots.    Cardiovascular:  The vascular exam is normal and is well perfused to distal extremities.  Distal pulses DP/PT: R. 2+; L. 2+.  There is cap refill noted less than two seconds in all digits. There is not edema of the bilateral lower extremities.  There is not varicosities noted in the distal extremities.      Lymph:  Upon palpation,  there is no lymphadenopathy noted in bilateral lower extremities.      Musculoskeletal:  Gait: antalgic; examination of the nails and digits reveal no cyanosis or clubbing.    Lumbar exam:  On visual inspection, there is not deformity of the spine.   full range of motion, no tenderness, palpable spasm or pain on motion. Special tests: Straight Leg Raise negative, Faber test negative.      Hip exam:   Upon inspection, there is not deformity noted.  Upon palpation there is not tenderness.  ROM: is  full and symmetrical.   Strength: Hip Flexors 5/5; Hip Abductors 5/5; Hip Adduction 5/5.     Knee exam:  Left knee exam shows;  range of motion of R. Knee is 0 to 120, and L. Knee is 0 to 115. The patient does have  pain on motion, effusion is mild, there is tenderness over the  medial region, there are not any masses, there is not ligamentous instability, there is not  deformity noted.    Knee exam: left positive for moderate  crepitations, some mild tenderness laxity is not noted with  stress.  There is not a popliteal cyst.    R. Knee:  Lachman's negative, Anterior Drawer negative, Posterior Drawer negative  McMurray's negative, Thallasy  negative,   PF  grind test negative, Apprehension test negative, Patellar J sign  negative  L. Knee:  Lachman's negative, Anterior Drawer negative, Posterior Drawer negative  McMurray's positive, Thallasy  positive,   PF grind test negative, Apprehension test negative,  Patellar J sign  negative    MRI:  Medial meniscus tear, moderate-severe medial joint narrowing  Radiographic findings reviewed with patient    Assessment:  Encounter Diagnoses   Name Primary?   . Acute medial meniscus tear of left knee, initial encounter Yes   . Primary osteoarthritis of left knee        Plan:  I had a lengthy discussion with the patient regarding their diagnosis. I explained treatment options including surgical vs non surgical treatment. I reviewed in detail the risks and benefits and outlined the procedure in detail with expected outcomes and possible complications.  I also discussed non surgical treatment such as injections (CSI and visco supplementation), physical therapy, topical creams and NSAID's. They have elected for conservative management at this time.   She overall is dogin well, she will follow up if symptoms present.

## 2018-01-10 ENCOUNTER — Encounter: Attending: Orthopaedic Surgery

## 2018-01-10 ENCOUNTER — Ambulatory Visit: Admit: 2018-01-10 | Discharge: 2018-01-10 | Payer: PRIVATE HEALTH INSURANCE | Attending: Orthopaedic Surgery

## 2018-01-10 DIAGNOSIS — M1712 Unilateral primary osteoarthritis, left knee: Secondary | ICD-10-CM

## 2018-01-10 NOTE — Progress Notes (Signed)
Chief Complaint   Patient presents with   . Knee Pain     Left knee pain continues.  Patient states she continues to have issues and would like to discuss surgical procedures.       Subjective:     Patient ID: Emma Pena is a 52 y.o.. Caucasian female    Knee Pain  Patient is following up today with her left knee pain.  She states it has increased and she is having difficulties with ADL's.  She would like to discuss further surgical intervenetion.    Past Medical History:   Diagnosis Date   . Anxiety    . Hypertension    . Thyroid disease      Past Surgical History:   Procedure Laterality Date   . CHOLECYSTECTOMY     . HYSTERECTOMY     . THYROID SURGERY  2000       Current Outpatient Prescriptions:   .  HYDROcodone-acetaminophen (NORCO) 10-325 MG per tablet, Take 1 tablet by mouth every 6 hours as needed for Pain.., Disp: , Rfl:   .  terbinafine (LAMISIL) 250 MG tablet, Take 250 mg by mouth daily, Disp: , Rfl:   .  metoprolol (TOPROL-XL) 100 MG XL tablet, Take 100 mg by mouth daily, Disp: , Rfl:   .  citalopram (CELEXA) 40 MG tablet, Take 40 mg by mouth daily, Disp: , Rfl:   .  hydrochlorothiazide (HYDRODIURIL) 12.5 MG tablet, Take 12.5 mg by mouth daily, Disp: , Rfl:   .  thyroid (ARMOUR) 15 MG tablet, Take 15 mg by mouth daily, Disp: , Rfl:   .  amLODIPine (NORVASC) 10 MG tablet, Take 10 mg by mouth daily, Disp: , Rfl:   .  pantoprazole (PROTONIX) 20 MG tablet, Take 1 tablet by mouth daily, Disp: 30 tablet, Rfl: 0  Allergies   Allergen Reactions   . Bee Venom Anaphylaxis     Social History     Social History   . Marital status: Single     Spouse name: N/A   . Number of children: N/A   . Years of education: N/A     Occupational History   . Not on file.     Social History Main Topics   . Smoking status: Current Every Day Smoker   . Smokeless tobacco: Not on file   . Alcohol use No   . Drug use: No   . Sexual activity: No     Other Topics Concern   . Not on file     Social History Narrative   . No narrative on  file     History reviewed. No pertinent family history.      REVIEW OF SYSTEMS:     General/Constitution:  (-)weight loss, (-)fever, (-)chills, (-)weakness.   Skin: (-) rash,(-) psoriasis,(-) eczema, (-)skin cancer.   Musculoskeletal: (-) fractures,  (-) dislocations,(-) collagen vascular disease, (-) fibromyalgia, (-) multiple sclerosis, (-) muscular dystrophy, (-) RSD,(-) joint pain (-)swelling, (-) joint pain,swelling.  Neurologic: (-) epilepsy, (-)seizures,(-) brain tumor,(-) TIA, (-)stroke, (-)headaches, (-)Parkinson disease,(-) memory loss, (-) LOC.  Cardiovascular: (-) Chest pain, (-) swelling in legs/feet, (-) SOB, (-) cramping in legs/feet with walking.  Respiratory: (-) SOB, (-) Coughing, (-) night sweats.  GI: (-) nausea, (-) vomiting, (-) diarrhea, (-) blood in stool, (-) gastric ulcer.  Psychiatric: (-) Depression, (-) Anxiety, (-) bipolar disease, (-) Alzheimer's Disease  Allergic/Immunologic: (-) allergies latex, (-) allergies metal, (-) skin sensitivity.  Hematlogic: (-) anemia, (-) blood transfusion, (-)  DVT/PE, (-) Clotting disorders      Subjective:  _Temp 98.5 F (36.9 C)   Ht 5\' 6"  (1.676 m)   Wt 210 lb (95.3 kg)   BMI 33.89 kg/m  Vital signs are stable.  In general, patient is awake, alert and oriented X3, in no apparent distress.  Examination of HENT reveals normocephalic, atraumatic.  PERRLA/EOMI sclera are white.  Conjunctivae are clear.  TM's are intact.  Pharynx is pink and moist.  Uvula and tongue are midline.  Heart: Positive S1 and positive S2 with regular rate and rhythm.  Lungs: Clear to auscultation bilaterally without rales, rhonchi or wheezes.  Abdomen: soft, nontender.  Positive bowel sounds.  No organomegaly.  No guarding or rigidity.      Constitution:  Temp 98.5 F (36.9 C)   Ht 5\' 6"  (1.676 m)   Wt 210 lb (95.3 kg)   BMI 33.89 kg/m       Psycihatric:  The patient is alert and oriented x 3, appears to be stated age and in no distress.      Respiratory:  Respiratory  effort is not labored.  Patient is not gasping.  Palpation of the chest reveals no tactile fremitus.    Skin:  Upon inspection: the skin appears warm, dry and intact.  There is  a previous scar over the affected area.There is any cellulitis, lymphedema or cutaneous lesions noted in the lower extremities.   Upon palpation there is no induration noted.      Neurologic:  Gait: antalgic;  Motor exam of the lower extremities show ; quadriceps, hamstrings, foot dorsi and plantar flexors intact R.  5/5 and L. 5/5. Deep tendon reflexes are 2/4 at the knees and 2/4 at the ankles with strong extensor hallicus longus motor strength bilaterally. Sensory to both feet is intact to all sensory roots.    Cardiovascular:  The vascular exam is normal and is well perfused to distal extremities.  Distal pulses DP/PT: R. 2+; L. 2+.  There is cap refill noted less than two seconds in all digits. There is not edema of the bilateral lower extremities.  There is not varicosities noted in the distal extremities.      Lymph:  Upon palpation,  there is no lymphadenopathy noted in bilateral lower extremities.      Musculoskeletal:  Gait: antalgic; examination of the nails and digits reveal no cyanosis or clubbing.    Lumbar exam:  On visual inspection, there is not deformity of the spine.   full range of motion, no tenderness, palpable spasm or pain on motion. Special tests: Straight Leg Raise negative, Faber test negative.      Hip exam:   Upon inspection, there is not deformity noted.  Upon palpation there is not tenderness.  ROM: is  full and symmetrical.   Strength: Hip Flexors 5/5; Hip Abductors 5/5; Hip Adduction 5/5.     Knee exam:  Left knee exam shows;  range of motion of R. Knee is 0 to 120, and L. Knee is 0 to 115. The patient does have  pain on motion, effusion is mild, there is tenderness over the  medial region, there are not any masses, there is not ligamentous instability, there is not  deformity noted.    Knee exam: left  positive for moderate crepitations, some mild tenderness laxity is not noted with  stress.  There is not a popliteal cyst.    R. Knee:  Lachman's negative, Anterior Drawer negative, Posterior Drawer negative  McMurray's negative, Thallasy  negative,   PF grind test negative, Apprehension test negative, Patellar J sign  negative  L. Knee:  Lachman's negative, Anterior Drawer negative, Posterior Drawer negative  McMurray's positive, Thallasy  positive,   PF grind test negative, Apprehension test negative,  Patellar J sign  Negative    Xray:  Moderate tricompartmental degenerative changes are noted,   greatest in the medial compartment. There is no joint effusion. There   is normal alignment. The patella is well located. No fractures are   identified.         MRI:  Medial meniscus tear, moderate-severe medial joint narrowing  Radiographic findings reviewed with patient    Assessment:  No diagnosis found.    Plan:  I had a lengthy discussion with the patient regarding their diagnosis. I explained treatment options including surgical vs non surgical treatment. I reviewed in detail the risks and benefits and outlined the procedure in detail with expected outcomes and possible complications.  I also discussed non surgical treatment such as injections (CSI and visco supplementation), physical therapy, topical creams and NSAID's. They have elected for surgical management at this time.   We discussed various treatment options both surgical and non-surgical.   The patient is unable to ambulate more than 100 feet and is unable to perform the average daily activities including:  Light housework, ADLs, donning clothes, toileting and exercise.  Patient has failed previous conservative measures including cortisone injections, NSAIDs, PT, HEP and pain medication and is currently a fall risk to the disability and decreased functioning.  The patient wishes to have the total knee arthroplasty.The risks and benefits of a total knee  replacement were discussed with the patient.  The risks include but are not limited to: infection, injury to blood vessels and nerves, non relief of symptoms, arthrofibrosis of knee, aseptic loosening of prosthesis, intraoperative fracture, blood loss, PE/DVT, MI, dislocation of hip and knee, need for further operative intervention and death.  The patient is aware of the risks and wished to proceed with a Left total knee replacement . We will call her with a date  We will obtain medical clearence from the PCP.  I discussed with the patient the typical post operative course following their total knee arthroplasty. The patient does have family support for post op care. Patient lives in a home with multiple steps. Patient will require intense post op physical therapy requiring walker. They also have a history of:   Past Medical History:   Diagnosis Date   . Anxiety    . Hypertension    . Thyroid disease      For this reason, it is medically necessary for them to be admitted as an inpatient following their total knee arthroplasty for optimal post operative results and safety.

## 2018-01-17 ENCOUNTER — Encounter

## 2018-01-29 ENCOUNTER — Inpatient Hospital Stay: Admit: 2018-01-29 | Payer: PRIVATE HEALTH INSURANCE

## 2018-01-29 DIAGNOSIS — M1712 Unilateral primary osteoarthritis, left knee: Secondary | ICD-10-CM

## 2018-01-29 LAB — EKG 12-LEAD
Atrial Rate: 57 {beats}/min
P Axis: 48 degrees
P-R Interval: 130 ms
Q-T Interval: 442 ms
QRS Duration: 68 ms
QTc Calculation (Bazett): 430 ms
R Axis: 31 degrees
T Axis: 71 degrees
Ventricular Rate: 57 {beats}/min

## 2018-01-29 LAB — BASIC METABOLIC PANEL W/ REFLEX TO MG FOR LOW K
Anion Gap: 13 mmol/L (ref 7–16)
BUN: 15 mg/dL (ref 6–20)
CO2: 26 mmol/L (ref 22–29)
Calcium: 9.6 mg/dL (ref 8.6–10.2)
Chloride: 100 mmol/L (ref 98–107)
Creatinine: 0.5 mg/dL (ref 0.5–1.0)
GFR African American: >60
GFR Non-African American: >60 mL/min/{1.73_m2} (ref >=60)
Glucose: 106 mg/dL — ABNORMAL HIGH (ref 74–99)
Potassium reflex Magnesium: 4 mmol/L (ref 3.5–5.0)
Sodium: 139 mmol/L (ref 132–146)

## 2018-01-29 LAB — URINALYSIS
Bilirubin Urine: NEGATIVE
Blood, Urine: NEGATIVE
Glucose, Ur: NEGATIVE mg/dL
Ketones, Urine: NEGATIVE mg/dL
Leukocyte Esterase, Urine: NEGATIVE
Nitrite, Urine: NEGATIVE
Protein, UA: NEGATIVE mg/dL
Specific Gravity, UA: 1.02 (ref 1.005–1.030)
Urobilinogen, Urine: 0.2 E.U./dL (ref <2.0)
pH, UA: 5.5 (ref 5.0–9.0)

## 2018-01-29 LAB — CBC
Hematocrit: 43.7 % (ref 34.0–48.0)
Hemoglobin: 14.4 g/dL (ref 11.5–15.5)
MCH: 28.8 pg (ref 26.0–35.0)
MCHC: 33 % (ref 32.0–34.5)
MCV: 87.4 fL (ref 80.0–99.9)
MPV: 9.6 fL (ref 7.0–12.0)
Platelets: 308 E9/L (ref 130–450)
RBC: 5 E12/L (ref 3.50–5.50)
RDW: 13.8 fL (ref 11.5–15.0)
WBC: 8.1 E9/L (ref 4.5–11.5)

## 2018-01-29 LAB — TYPE AND SCREEN
ABO/Rh: A POS
Antibody Screen: NEGATIVE

## 2018-01-29 LAB — PROTIME-INR
INR: 1
Protime: 11.5 s (ref 9.3–12.4)

## 2018-01-29 LAB — APTT: aPTT: 34.5 s (ref 24.5–35.1)

## 2018-01-29 LAB — PREALBUMIN: Prealbumin: 35 mg/dL (ref 20–40)

## 2018-01-29 NOTE — Progress Notes (Signed)
[facility]                                                                                                                     PRE OP INSTRUCTIONS FOR  Renaee Mundaachael Loh        Date: 01/29/2018    Date and time of surgery: 02/06/18   Arrival Time: nurse will call you the night before between 5-7pm   Go to information desk    1. Do not eat or drink anything after 12 midnight prior to surgery. This includes no water, chewing gum, mints or ice chips.    2. Take the following pills with a small sip of water on the morning of Surgery: may take pain med if needed     3. Diabetics may take evening dose of insulin but none after midnight.  If you feel symptomatic or low blood sugar take 1-2 ounces of apple juice only.    4. Aspirin, Ibuprofen, Advil, Naproxen, Vitamin E and other Anti-inflammatory products should be stopped  before surgery  as directed by your physician.    5. Check with your Doctor regarding stopping Plavix, Coumadin, Lovenox, Fragmin or other blood thinners.    6. Do not smoke,use illicit drugs and do not drink any alcoholic beverages 24 hours prior to surgery.    7. You may brush your teeth and gargle the morning of surgery.  DO NOT SWALLOW WATER      8. Please wear simple, loose fitting clothing to the hospital.  Do not bring valuables (money, credit cards, checkbooks, etc.) Do not wear any makeup (including no eye makeup) or nail polish on your fingers or toes.    9. DO NOT wear any jewelry or piercings on day of surgery.  All body piercing jewelry must be removed.    10. Shower the night before surgery with ___Antibacterial soap /sage wipes    11. Remember to bring Blood Bank bracelet to the hospital on the day of surgery.    12. If you have a Living Will and Durable Power of Attorney for Healthcare, please bring in a copy.    13. If appropriate bring  inspirex, etc...    14. Notify your Surgeon if you develop any illness between now and surgery time, cough, cold, fever, sore throat, nausea,  vomiting, etc.  Please notify your surgeon if you experience dizziness, shortness of breath or blurred vision between now & the time of your surgery.    15. If you have ___dentures, they will be removed before going to the OR; we will provide you a container. If you wear ___contact lenses or ___glasses, they will be removed; please bring a case for them.    16. To provide excellent care visitors will be limited to one in the room at any given time  103. During flu season no children under the age of 31 are permitted in the hospital for the safety of all patients.     18. Other                  Please call pre admission testing if you have any further questions.   [place]     [facility]

## 2018-01-30 LAB — CULTURE, URINE

## 2018-01-30 LAB — CULTURE, MRSA, SCREENING

## 2018-02-04 ENCOUNTER — Ambulatory Visit: Payer: PRIVATE HEALTH INSURANCE

## 2018-02-06 ENCOUNTER — Inpatient Hospital Stay
Admit: 2018-02-06 | Discharge: 2018-02-07 | Disposition: A | Discharge status: Home / Self Care | Payer: PRIVATE HEALTH INSURANCE | Attending: Orthopaedic Surgery | Admitting: Orthopaedic Surgery

## 2018-02-06 ENCOUNTER — Inpatient Hospital Stay: Admit: 2018-02-06 | Payer: PRIVATE HEALTH INSURANCE

## 2018-02-06 DIAGNOSIS — M1712 Unilateral primary osteoarthritis, left knee: Secondary | ICD-10-CM

## 2018-02-06 MED ORDER — OXYCODONE-ACETAMINOPHEN 5-325 MG PO TABS
5-325 MG | ORAL_TABLET | Freq: Three times a day (TID) | ORAL | 0 refills | Status: AC | PRN
Start: 2018-02-06 — End: 2018-02-13

## 2018-02-06 MED ORDER — MEPERIDINE HCL 25 MG/ML IJ SOLN
25 MG/ML | INTRAMUSCULAR | Status: AC
Start: 2018-02-06 — End: 2018-02-07

## 2018-02-06 MED ORDER — CELECOXIB 100 MG PO CAPS
100 MG | Freq: Once | ORAL | Status: DC
Start: 2018-02-06 — End: 2018-02-06

## 2018-02-06 MED ORDER — MIDAZOLAM HCL 2 MG/2ML IJ SOLN
2 MG/ML | INTRAMUSCULAR | Status: DC | PRN
Start: 2018-02-06 — End: 2018-02-06
  Administered 2018-02-06: 18:00:00 2 via INTRAVENOUS

## 2018-02-06 MED ORDER — PROPOFOL 500 MG/50ML IV EMUL
500 MG/50ML | INTRAVENOUS | Status: DC | PRN
Start: 2018-02-06 — End: 2018-02-06
  Administered 2018-02-06: 18:00:00 40 via INTRAVENOUS

## 2018-02-06 MED ORDER — KETOROLAC TROMETHAMINE 30 MG/ML IJ SOLN
30 MG/ML | INTRAMUSCULAR | Status: DC
Start: 2018-02-06 — End: 2018-02-06

## 2018-02-06 MED ORDER — MAGNESIUM HYDROXIDE 400 MG/5ML PO SUSP
400 MG/5ML | Freq: Every day | ORAL | Status: DC | PRN
Start: 2018-02-06 — End: 2018-02-07

## 2018-02-06 MED ORDER — CHLORHEXIDINE GLUCONATE 4 % EX LIQD
4 % | CUTANEOUS | Status: DC
Start: 2018-02-06 — End: 2018-02-06

## 2018-02-06 MED ORDER — THYROID 30 MG PO TABS
30 MG | Freq: Every evening | ORAL | Status: DC
Start: 2018-02-06 — End: 2018-02-07
  Administered 2018-02-07: 01:00:00 15 mg via ORAL

## 2018-02-06 MED ORDER — FENTANYL CITRATE (PF) 250 MCG/5ML IJ SOLN
250 MCG/5ML | INTRAMUSCULAR | Status: DC | PRN
Start: 2018-02-06 — End: 2018-02-06
  Administered 2018-02-06: 18:00:00 25 via INTRASPINAL
  Administered 2018-02-06 (×2): 25 via INTRAVENOUS
  Administered 2018-02-06 (×2): 50 via INTRAVENOUS
  Administered 2018-02-06: 18:00:00 25 via INTRAVENOUS
  Administered 2018-02-06: 18:00:00 50 via INTRAVENOUS

## 2018-02-06 MED ORDER — KETOROLAC TROMETHAMINE 30 MG/ML IJ SOLN
30 MG/ML | INTRAMUSCULAR | Status: AC
Start: 2018-02-06 — End: 2018-02-07

## 2018-02-06 MED ORDER — MORPHINE SULFATE 4 MG/ML IJ SOLN
4 MG/ML | INTRAMUSCULAR | Status: DC | PRN
Start: 2018-02-06 — End: 2018-02-07
  Administered 2018-02-07 (×2): 4 mg via INTRAVENOUS

## 2018-02-06 MED ORDER — PROMETHAZINE HCL 25 MG/ML IJ SOLN
25 MG/ML | INTRAMUSCULAR | Status: DC | PRN
Start: 2018-02-06 — End: 2018-02-06

## 2018-02-06 MED ORDER — CELECOXIB 100 MG PO CAPS
100 MG | Freq: Two times a day (BID) | ORAL | Status: DC
Start: 2018-02-06 — End: 2018-02-07
  Administered 2018-02-07: 08:00:00 200 mg via ORAL

## 2018-02-06 MED ORDER — LIDOCAINE HCL (CARDIAC) 20 MG/ML IV SOLN
20 MG/ML | INTRAVENOUS | Status: DC | PRN
Start: 2018-02-06 — End: 2018-02-06
  Administered 2018-02-06: 18:00:00 50 via INTRAVENOUS

## 2018-02-06 MED ORDER — PROPOFOL 500 MG/50ML IV EMUL
500 MG/50ML | INTRAVENOUS | Status: DC | PRN
Start: 2018-02-06 — End: 2018-02-06
  Administered 2018-02-06: 18:00:00 60 via INTRAVENOUS

## 2018-02-06 MED ORDER — CELECOXIB 100 MG PO CAPS
100 MG | Freq: Once | ORAL | Status: AC
Start: 2018-02-06 — End: 2018-02-06
  Administered 2018-02-06: 17:00:00 200 mg via ORAL

## 2018-02-06 MED ORDER — MIDAZOLAM HCL 2 MG/2ML IJ SOLN
2 | INTRAMUSCULAR | Status: AC
Start: 2018-02-06 — End: 2018-02-06

## 2018-02-06 MED ORDER — NORMAL SALINE FLUSH 0.9 % IV SOLN
0.9 % | INTRAVENOUS | Status: DC | PRN
Start: 2018-02-06 — End: 2018-02-06

## 2018-02-06 MED ORDER — LABETALOL HCL 5 MG/ML IV SOLN
5 MG/ML | INTRAVENOUS | Status: DC | PRN
Start: 2018-02-06 — End: 2018-02-06

## 2018-02-06 MED ORDER — CITALOPRAM HYDROBROMIDE 20 MG PO TABS
20 MG | Freq: Every evening | ORAL | Status: DC
Start: 2018-02-06 — End: 2018-02-07
  Administered 2018-02-07: 01:00:00 40 mg via ORAL

## 2018-02-06 MED ORDER — LACTATED RINGERS IV SOLN
INTRAVENOUS | Status: DC
Start: 2018-02-06 — End: 2018-02-06
  Administered 2018-02-06 (×2): via INTRAVENOUS

## 2018-02-06 MED ORDER — DOCUSATE SODIUM 100 MG PO CAPS
100 MG | Freq: Two times a day (BID) | ORAL | Status: DC
Start: 2018-02-06 — End: 2018-02-07
  Administered 2018-02-07 (×2): 100 mg via ORAL

## 2018-02-06 MED ORDER — PROMETHAZINE HCL 25 MG/ML IJ SOLN
25 MG/ML | Freq: Four times a day (QID) | INTRAMUSCULAR | Status: DC | PRN
Start: 2018-02-06 — End: 2018-02-07

## 2018-02-06 MED ORDER — GABAPENTIN 300 MG PO CAPS
300 MG | Freq: Once | ORAL | Status: AC
Start: 2018-02-06 — End: 2018-02-06
  Administered 2018-02-06: 17:00:00 600 mg via ORAL

## 2018-02-06 MED ORDER — ACETAMINOPHEN 325 MG PO TABS
325 MG | ORAL | Status: DC | PRN
Start: 2018-02-06 — End: 2018-02-07

## 2018-02-06 MED ORDER — ROCURONIUM BROMIDE 100 MG/10ML IV SOLN
100 | INTRAVENOUS | Status: AC
Start: 2018-02-06 — End: 2018-02-06

## 2018-02-06 MED ORDER — CEFAZOLIN SODIUM-DEXTROSE 2-3 GM-%(50ML) IV SOLR
2000 mg in Dextrose 3% | INTRAVENOUS | Status: AC
Start: 2018-02-06 — End: 2018-02-07

## 2018-02-06 MED ORDER — MIDAZOLAM HCL 2 MG/2ML IJ SOLN
2 MG/ML | INTRAMUSCULAR | Status: AC
Start: 2018-02-06 — End: 2018-02-06

## 2018-02-06 MED ORDER — BISACODYL 10 MG RE SUPP
10 MG | RECTAL | Status: DC | PRN
Start: 2018-02-06 — End: 2018-02-07

## 2018-02-06 MED ORDER — PROPOFOL 500 MG/50ML IV EMUL
500 | INTRAVENOUS | Status: AC
Start: 2018-02-06 — End: 2018-02-06

## 2018-02-06 MED ORDER — THERAPEUTIC MULTIVIT/MINERAL PO TABS
Freq: Every day | ORAL | Status: DC
Start: 2018-02-06 — End: 2018-02-07
  Administered 2018-02-07: 14:00:00 1 via ORAL

## 2018-02-06 MED ORDER — ONDANSETRON HCL 4 MG/2ML IJ SOLN
4 | INTRAMUSCULAR | Status: AC
Start: 2018-02-06 — End: 2018-02-06

## 2018-02-06 MED ORDER — PHENYLEPHRINE HCL 10 MG/ML IJ SOLN
10 MG/ML | INTRAMUSCULAR | Status: DC | PRN
Start: 2018-02-06 — End: 2018-02-06
  Administered 2018-02-06 (×2): 100 via INTRAVENOUS

## 2018-02-06 MED ORDER — VANCOMYCIN HCL 1 G IV SOLR
1 g | INTRAVENOUS | Status: DC | PRN
Start: 2018-02-06 — End: 2018-02-06
  Administered 2018-02-06: 20:00:00 1 via INTRA_ARTICULAR

## 2018-02-06 MED ORDER — STERILE WATER FOR INJECTION (MIXTURES ONLY)
1 g | Freq: Three times a day (TID) | INTRAMUSCULAR | Status: AC
Start: 2018-02-06 — End: 2018-02-07
  Administered 2018-02-06 – 2018-02-07 (×3): 2 g via INTRAVENOUS

## 2018-02-06 MED ORDER — SODIUM CHLORIDE 0.9 % IV SOLN
0.9 % | INTRAVENOUS | Status: DC
Start: 2018-02-06 — End: 2018-02-07
  Administered 2018-02-07: 01:00:00 via INTRAVENOUS

## 2018-02-06 MED ORDER — HYDROMORPHONE HCL PF 1 MG/ML IJ SOLN
1 MG/ML | INTRAMUSCULAR | Status: DC | PRN
Start: 2018-02-06 — End: 2018-02-06
  Administered 2018-02-06: 21:00:00 0.5 mg via INTRAVENOUS

## 2018-02-06 MED ORDER — AMLODIPINE BESYLATE 10 MG PO TABS
10 MG | Freq: Every evening | ORAL | Status: DC
Start: 2018-02-06 — End: 2018-02-06

## 2018-02-06 MED ORDER — ACETAMINOPHEN 500 MG PO TABS
500 MG | Freq: Once | ORAL | Status: AC
Start: 2018-02-06 — End: 2018-02-06
  Administered 2018-02-06: 17:00:00 1000 mg via ORAL

## 2018-02-06 MED ORDER — HYDROMORPHONE HCL 1 MG/ML IJ SOLN
1 MG/ML | INTRAMUSCULAR | Status: AC
Start: 2018-02-06 — End: 2018-02-07

## 2018-02-06 MED ORDER — ROPIVACAINE HCL 5 MG/ML IJ SOLN
Freq: Once | INTRAMUSCULAR | Status: AC
Start: 2018-02-06 — End: 2018-02-06
  Administered 2018-02-06: 17:00:00 30 mL

## 2018-02-06 MED ORDER — MEPERIDINE HCL 25 MG/ML IJ SOLN
25 MG/ML | INTRAMUSCULAR | Status: DC | PRN
Start: 2018-02-06 — End: 2018-02-06
  Administered 2018-02-06 (×2): 12.5 mg via INTRAVENOUS

## 2018-02-06 MED ORDER — MIDAZOLAM HCL 2 MG/2ML IJ SOLN
2 MG/ML | Freq: Once | INTRAMUSCULAR | Status: AC
Start: 2018-02-06 — End: 2018-02-06
  Administered 2018-02-06: 17:00:00 1 mg via INTRAVENOUS

## 2018-02-06 MED ORDER — DEXAMETHASONE SODIUM PHOSPHATE 10 MG/ML IJ SOLN
10 MG/ML | INTRAMUSCULAR | Status: DC | PRN
Start: 2018-02-06 — End: 2018-02-06
  Administered 2018-02-06: 18:00:00 10 via INTRAVENOUS

## 2018-02-06 MED ORDER — PANTOPRAZOLE SODIUM 40 MG PO TBEC
40 MG | Freq: Every day | ORAL | Status: DC
Start: 2018-02-06 — End: 2018-02-07
  Administered 2018-02-07: 11:00:00 40 mg via ORAL

## 2018-02-06 MED ORDER — RIVAROXABAN 10 MG PO TABS
10 MG | Freq: Every day | ORAL | Status: DC
Start: 2018-02-06 — End: 2018-02-07
  Administered 2018-02-07: 14:00:00 10 mg via ORAL

## 2018-02-06 MED ORDER — GABAPENTIN 100 MG PO CAPS
100 MG | Freq: Three times a day (TID) | ORAL | Status: DC
Start: 2018-02-06 — End: 2018-02-07
  Administered 2018-02-07 (×2): 100 mg via ORAL

## 2018-02-06 MED ORDER — PROPOFOL 200 MG/20ML IV EMUL
200 | INTRAVENOUS | Status: AC
Start: 2018-02-06 — End: 2018-02-06

## 2018-02-06 MED ORDER — ROPIVACAINE HCL 5 MG/ML IJ SOLN
INTRAMUSCULAR | Status: AC
Start: 2018-02-06 — End: 2018-02-06

## 2018-02-06 MED ORDER — METOPROLOL SUCCINATE ER 50 MG PO TB24
50 MG | Freq: Every evening | ORAL | Status: DC
Start: 2018-02-06 — End: 2018-02-07
  Administered 2018-02-07: 01:00:00 50 mg via ORAL

## 2018-02-06 MED ORDER — HYDROMORPHONE HCL PF 1 MG/ML IJ SOLN
1 MG/ML | INTRAMUSCULAR | Status: DC | PRN
Start: 2018-02-06 — End: 2018-02-06

## 2018-02-06 MED ORDER — LIDOCAINE HCL (CARDIAC) 20 MG/ML IV SOLN
20 | INTRAVENOUS | Status: AC
Start: 2018-02-06 — End: 2018-02-06

## 2018-02-06 MED ORDER — GABAPENTIN 100 MG PO CAPS
100 MG | ORAL_CAPSULE | Freq: Three times a day (TID) | ORAL | 0 refills | Status: AC
Start: 2018-02-06 — End: 2018-02-20

## 2018-02-06 MED ORDER — NORMAL SALINE FLUSH 0.9 % IV SOLN
0.9 % | Freq: Two times a day (BID) | INTRAVENOUS | Status: DC
Start: 2018-02-06 — End: 2018-02-06

## 2018-02-06 MED ORDER — ASPIRIN EC 325 MG PO TBEC
325 MG | ORAL_TABLET | Freq: Two times a day (BID) | ORAL | 0 refills | Status: DC
Start: 2018-02-06 — End: 2019-03-07

## 2018-02-06 MED ORDER — FENTANYL CITRATE (PF) 100 MCG/2ML IJ SOLN
100 MCG/2ML | Freq: Once | INTRAMUSCULAR | Status: AC
Start: 2018-02-06 — End: 2018-02-06
  Administered 2018-02-06: 17:00:00 50 ug via INTRAVENOUS

## 2018-02-06 MED ORDER — MORPHINE SULFATE 10 MG/ML IJ SOLN
10 MG/ML | INTRAMUSCULAR | Status: DC | PRN
Start: 2018-02-06 — End: 2018-02-06
  Administered 2018-02-06: 20:00:00 50 via INTRA_ARTICULAR

## 2018-02-06 MED ORDER — FENTANYL CITRATE (PF) 250 MCG/5ML IJ SOLN
250 | INTRAMUSCULAR | Status: AC
Start: 2018-02-06 — End: 2018-02-06

## 2018-02-06 MED ORDER — OXYCODONE HCL ER 10 MG PO T12A
10 MG | Freq: Once | ORAL | Status: AC
Start: 2018-02-06 — End: 2018-02-06
  Administered 2018-02-06: 17:00:00 10 mg via ORAL

## 2018-02-06 MED ORDER — CEFAZOLIN SODIUM-DEXTROSE 2-3 GM-%(50ML) IV SOLR
2000 mg in Dextrose 3% | INTRAVENOUS | Status: AC
Start: 2018-02-06 — End: 2018-02-06
  Administered 2018-02-06: 18:00:00 2 g via INTRAVENOUS

## 2018-02-06 MED ORDER — FENTANYL CITRATE (PF) 100 MCG/2ML IJ SOLN
100 MCG/2ML | INTRAMUSCULAR | Status: AC
Start: 2018-02-06 — End: 2018-02-06

## 2018-02-06 MED ORDER — CELECOXIB 100 MG PO CAPS
100 MG | ORAL_CAPSULE | Freq: Two times a day (BID) | ORAL | 0 refills | Status: AC
Start: 2018-02-06 — End: 2018-02-20

## 2018-02-06 MED ORDER — ONDANSETRON HCL 4 MG/2ML IJ SOLN
4 MG/2ML | INTRAMUSCULAR | Status: DC | PRN
Start: 2018-02-06 — End: 2018-02-06
  Administered 2018-02-06: 19:00:00 4 via INTRAVENOUS

## 2018-02-06 MED ORDER — OXYCODONE-ACETAMINOPHEN 10-325 MG PO TABS
10-325 MG | ORAL | Status: DC | PRN
Start: 2018-02-06 — End: 2018-02-07
  Administered 2018-02-06 – 2018-02-07 (×4): 1 via ORAL

## 2018-02-06 MED ORDER — ALLOPURINOL 300 MG PO TABS
300 MG | Freq: Every day | ORAL | Status: DC
Start: 2018-02-06 — End: 2018-02-07
  Administered 2018-02-07: 01:00:00 300 mg via ORAL

## 2018-02-06 MED ORDER — VANCOMYCIN HCL 1 G IV SOLR
1 | INTRAVENOUS | Status: AC
Start: 2018-02-06 — End: 2018-02-06

## 2018-02-06 MED FILL — ROCURONIUM BROMIDE 100 MG/10ML IV SOLN: 100 MG/10ML | INTRAVENOUS | Qty: 10

## 2018-02-06 MED FILL — HYDROMORPHONE HCL 1 MG/ML IJ SOLN: 1 mg/mL | INTRAMUSCULAR | Qty: 0.5

## 2018-02-06 MED FILL — OXYCODONE-ACETAMINOPHEN 10-325 MG PO TABS: 10-325 mg | ORAL | Qty: 1

## 2018-02-06 MED FILL — ROPIVACAINE HCL 5 MG/ML IJ SOLN: INTRAMUSCULAR | Qty: 40

## 2018-02-06 MED FILL — OXYCONTIN 10 MG PO T12A: 10 mg | ORAL | Qty: 1

## 2018-02-06 MED FILL — CELECOXIB 100 MG PO CAPS: 100 mg | ORAL | Qty: 2

## 2018-02-06 MED FILL — CEFAZOLIN SODIUM-DEXTROSE 2-3 GM-%(50ML) IV SOLR: 2000 mg in Dextrose 3% | INTRAVENOUS | Qty: 50

## 2018-02-06 MED FILL — ARMOUR THYROID 30 MG PO TABS: 30 mg | ORAL | Qty: 0.5

## 2018-02-06 MED FILL — DEMEROL 25 MG/ML IJ SOLN: 25 mg/mL | INTRAMUSCULAR | Qty: 1

## 2018-02-06 MED FILL — LIDOCAINE HCL (CARDIAC) 20 MG/ML IV SOLN: 20 mg/mL | INTRAVENOUS | Qty: 5

## 2018-02-06 MED FILL — PROPOFOL 200 MG/20ML IV EMUL: 200 MG/20ML | INTRAVENOUS | Qty: 20

## 2018-02-06 MED FILL — DIPRIVAN 500 MG/50ML IV EMUL: 500 MG/50ML | INTRAVENOUS | Qty: 50

## 2018-02-06 MED FILL — ONDANSETRON HCL 4 MG/2ML IJ SOLN: 4 MG/2ML | INTRAMUSCULAR | Qty: 2

## 2018-02-06 MED FILL — MIDAZOLAM HCL 2 MG/2ML IJ SOLN: 2 mg/mL | INTRAMUSCULAR | Qty: 2

## 2018-02-06 MED FILL — TYLENOL EXTRA STRENGTH 500 MG PO TABS: 500 mg | ORAL | Qty: 2

## 2018-02-06 MED FILL — FENTANYL CITRATE (PF) 100 MCG/2ML IJ SOLN: 100 MCG/2ML | INTRAMUSCULAR | Qty: 2

## 2018-02-06 MED FILL — GABAPENTIN 300 MG PO CAPS: 300 mg | ORAL | Qty: 2

## 2018-02-06 MED FILL — FENTANYL CITRATE (PF) 250 MCG/5ML IJ SOLN: 250 MCG/5ML | INTRAMUSCULAR | Qty: 5

## 2018-02-06 MED FILL — XARELTO 10 MG PO TABS: 10 mg | ORAL | Qty: 1

## 2018-02-06 MED FILL — VANCOMYCIN HCL 1 G IV SOLR: 1 g | INTRAVENOUS | Qty: 1000

## 2018-02-06 MED FILL — SENSORCAINE 0.5 % IJ SOLN: 0.5 % | INTRAMUSCULAR | Qty: 10

## 2018-02-06 NOTE — Progress Notes (Signed)
Physical Therapy    0317/0317-01    PHYSICAL THERAPY INITIAL EVALUATION  Tentative placement recommendation:   Home with home p.t.   Equipment prescriptions needed:  wheeled walker     Plan of care: Patient will be seen twice daily for therapeutic exercise, functional retraining, endurance activities, balance exercises, family and patient education.      AM-PAC Mobility Inpatient   How much difficulty turning over in bed?: A Little  How much difficulty sitting down on / standing up from a chair with arms?: A Little  How much difficulty moving from lying on back to sitting on side of bed?: A Little  How much help from another person moving to and from a bed to a chair?: A Little  How much help from another person needed to walk in hospital room?: A Little  How much help from another person for climbing 3-5 steps with a railing?: A Lot  AM-PAC Inpatient Mobility Raw Score : 17  AM-PAC Inpatient T-Scale Score : 42.13  Mobility Inpatient CMS 0-100% Score: 50.57  Mobility Inpatient CMS G-Code Modifier : CK      Order: evaluate and treat  Diagnosis:    1. Primary osteoarthritis of left knee     s/p total knee   replacement; Dr. Sherral Hammers    Past medical history:       Diagnosis Date   . Anxiety    . Arthritis    . GERD (gastroesophageal reflux disease)    . Hypertension    . Thyroid disease    ;      Procedure Laterality Date   . CHOLECYSTECTOMY     . HYSTERECTOMY     . THYROID SURGERY Left 2000       The admitting diagnosis and active problem list as listed above have been reviewed prior to the initiation of this evaluation.  Nursing cleared the patient for evaluation.   Patient is agreeable to evaluation.    Precautions: falls, full weight bearing     Social history: Patient lives with family parents  in a single family home with 4 steps to enter with rail    Equipment owned: ,  Engineer, civil (consulting)     Mentation: alert, cooperative, oriented x 3 and follows directions,    Prior level of function: independent      ROM: within  functional limits with exception of left knee 5-75 degrees  STRENGTH: within functional limits with exception of left lower extremity   3/5  PAIN: (measured on a visual analog scale with 0=no pain and 10=excruciating pain) 3/10.   FUNCTIONAL ASSESSMENT   Bed Mobility- Supine to sit-Minimal assists       Scooting- Minimal assists       Sit to supine- Minimal assists     Patient able to dangle on edge of bed for 10 minutes with min assist   Transfers-sit to stand- Minimal assists     Gait:  Patient ambulated 5 feet using  wheeled walker  with Minimal assists .      Balance: sit-good       stand fair       Edema: yes -      Endurance: fair       Treatment: evaluation, dangle, gait    Patient/family education: Reviewed goals and plan of care.   Patient/family instructed in ankle pumps, quad sets, proper positioning of lower extremity to prevent contracture,use incentive spirometer and pain medicine prior to therapy treatment.  Patient/family verbalized understanding and needs reinforcement        Rehab Potential: good  for baseline  Patient's Goal: be able to climb steps and heal right  ASSESSMENT  Patient exhibits decreased rom, strength, endurance,  balance, coordination impairing functional mobility.  Goals to be met in 3 days.  Bed mobility-Supervision  Transfers-Supervision  Ambulation-Supervision for 100 feet using  wheeled walker  Stairs-up and down 4  step(s) using 1 rail(s) with Independent.    Comments:   Increase rom in affected joints by 10%  Increase strength in affected muscle groups by 1/3 grade  Increase balance to allow for improvement towards functional goals.  Increase endurance to allow for improvement towards functional goals.

## 2018-02-06 NOTE — Anesthesia Procedure Notes (Signed)
Peripheral Block    Patient location during procedure: procedure area  Start time: 02/06/2018 12:10 PM  End time: 02/06/2018 12:20 PM  Staffing  Anesthesiologist: Sonny MastersSHEAKOSKI, Ponciano Shealy L  Performed: anesthesiologist   Preanesthetic Checklist  Completed: patient identified, site marked, surgical consent, pre-op evaluation, timeout performed, IV checked, risks and benefits discussed, monitors and equipment checked, anesthesia consent given, oxygen available and patient being monitored  Peripheral Block  Patient position: supine  Prep: ChloraPrep  Patient monitoring: cardiac monitor, frequent blood pressure checks and IV access  Block type: Femoral  Laterality: left  Injection technique: single-shot  Procedures: ultrasound guided  Local infiltration: bupivacaine  Infiltration strength: 0.5 %  Dose: 30 mL  Adductor canal  Provider prep: mask and sterile gloves  Local infiltration: bupivacaine  Needle  Needle type: combined needle/nerve stimulator   Needle gauge: 22 G  Needle length: 10 cm  Needle localization: ultrasound guidance  Assessment  Injection assessment: negative aspiration for heme, no paresthesia on injection and local visualized surrounding nerve on ultrasound  Slow fractionated injection: yes  Hemodynamics: stable  Reason for block: post-op pain management

## 2018-02-06 NOTE — Op Note (Signed)
Department of Orthopedic Surgery  Operative Report        Pre-operative Diagnosis:  Left Knee Osteoarthritis    Post-operative Diagnosis:  Left Knee Osteoarthritis    Procedure:  Left Knee Arthroplasty    Surgeon:  Girtha HakeWilliam B Corrinne Benegas, DO     Assistant(s):  Jfurgsuon/lsmith/swanner    Anesthesia:  General endotrachial anesthesia    Estimated blood loss:  Minimal    Specimens:  none    Implants:  triathalon femur 6, tibia 4, ti 9, p 29x9    Complications:  none    Condition:  Stable    Brief Hospital Course:  Emma Pena is a patient known to Girtha HakeWilliam B Cherelle Midkiff, DO's practice with persistent complaints of Left knee pain. Knee pain has failed to be relieved by non-operative conservative measures, and has began affecting daily activities of living. After examination of the patient, review of the radiologic studies, and appropriate pre-operative risk assessment, Girtha HakeWilliam B Olliver Boyadjian, DO recommended Left knee arthroplasty, which the patient was agreeable towards.    Operative Course:  The patient was seen and identified outside the operative suite, in which the operative site was marked as appropriate by patient, surgeon, staff, and anesthesia. The patient was then taken into the operative suite, transferred to the operative table with all bony prominences and neurovascular structures well padded and protected. A tourniquet was placed high on the proximal thigh of the operative extremity. The patient was sedated under the care of the anesthesia team. The operative site was prepped and draped in standard sterile fashion. The tourniquet was inflated to 300 mmHg. An anterior midline incision was made over the knee with full-thickness flaps reflected revealing the anterior joint capsule. Medial parapatellar arthrotomy was performed reflecting the patella laterally. Anterior fat pad and anterior horns of the lateral and medial meniscus were sharply excised. The knee was flexed up. Anterior cruciate ligament and posterior cruciate  ligament were then excised. All osteophytes on the distal femur were removed with rongeur. At this point, drilling of the intramedullary canal of the distal femur was then performed. Distal femoral cutting jig was then placed and pinned in appropriate position. An oscillating saw was used to make the distal femoral cut, discarding the pieces of cut femoral bone and sent for study. The posterior reference guide was then placed on the distal femur after the intramedullary guide and the distal femoral cutting guide were then removed. The posterior referencing guide was then pinned in appropriate position. Angel wing was used to confirm appropriate femoral trial size according to pre-operative templating. Posterior reference guide was then removed. An appropriate sized 4-in-1 cutting guide was applied to the distal femur. Oscillating saw was used to make the anterior, posterior, and chamfer cuts. The 4-in-1 cutting guide was then removed. Attention was turned to the tibia. Intramedullary drilling was then performed of the proximal tibia. The intramedullary guide with the proximal tibial cutting guide was then placed on the tibia. Proximal tibial cutting guide was then pinned in appropriate position. After pinning the proximal tibial cutting guide in appropriate position, oscillating saw was then used to make the proximal tibial cut. The guide was then removed. The proximal tibia that was cut was sharply excised and removed. At this time, all osteophytes on the proximal tibia were then removed with a rongeur. Tensiometer was then placed in extension and flexion, confirming appropriate ligamentous balancing. The box-cutting guide for the posterior-stabilized knee was then placed on the distal femur. The box was then cut in  the distal femur without complication. Box-cutting guide was then removed. An appropriately sized trial tibial baseplate and a polyethylene component trial were then placed into the knee. The  appropriately sized femur trial component was then placed. The knee was reduced and taken through a range of motion and found to be appropriately stable. Half pins were then placed in the tibial base plate confirming position in preparation for keel punch. The patella was then prepared.  The patella surface was free hand cut for the appropriate size implant.  The patella holes were drilled and the patella was then trialed.  There was good alignment and tracking of the patella.  Distal femoral component was removed, trial poly was then removed. Keel punch was then performed of the proximal tibial. Tibial base plate was then removed. Copious irrigation was performed of the wound and final inspection for all interposed soft tissue and loose bodies was then performed as cement was being mixed. Copious irrigation was performed once again of the knee. Cement was placed on the proximal tibial component and the tibial component was then impacted into appropriate position with all excess extruded cement being removed with Therapist, nutritional. A polyethylene component was then impacted into appropriate position and checked for stability, which it was stable. Cement was then placed on the distal femoral component. Distal femoral component was then impacted in appropriate position with all excess extruded cement being removed with a Therapist, nutritional. The knee was extended, taken through a range of motion, and found to be appropriately stable. Final inspection for all excess extruded cement was then performed. Freer elevator removed all excess extruded cement. Copious irrigation was performed of the knee.  The knee was then soaked with a bedadine solution soak for 3 minutes.     The knee was then throughly irrigated with saline solution. Then 1 gram of vanomycin powder was placed within the wound.   The capsule was  then closed with strata-fix closure. I then injected our TXA mixture into the knee joint.  Copious irrigation was  performed of this layer followed by, 2-0 Vicryl for the subcutaneous tissues, and zip line was used to secure incision. A sterile layered dressing was placed over the wound. Tourniquet was deflated. The patient was awakened from anesthesia, transferred to the hospital bed, and taken to the PACU in stable condition.     Disposition: The patient was taken to PACU in stable condition. Once stable, the patient will be transferred to the floor. Orders have been provided to begin physical therapy, weight bear as tolerated Left lower extremity. Patient received a dose of antibiotics preoperatively. We will continue this for 24 hours postoperatively for infection prophylaxis. The patient will also be started on xarelto for DVT prophylaxis. We have consulted social services and case management for discharge planning and consulted the PCP for medical management.

## 2018-02-06 NOTE — Anesthesia Pre-Procedure Evaluation (Addendum)
Department of Anesthesiology  Preprocedure Note       Name:  Emma Pena   Age:  52 y.o.  DOB:  24-Jun-1966                                          MRN:  [id number]         Date:  01/29/2018      Surgeon: Moishe Spice):  Girtha Hake, DO    Procedure: LEFT KNEE TOTAL ARTHROPLASTY (VISIONAIRE) (Left )    Medications prior to admission:   Prior to Admission medications    Medication Sig Start Date End Date Taking? Authorizing Provider   allopurinol (ZYLOPRIM) 300 MG tablet TAKE 1 TABLET BY MOUTH EVERY DAY  at night 01/03/18   Historical Provider, MD   omeprazole (PRILOSEC) 20 MG delayed release capsule Take 20 mg by mouth nightly     Historical Provider, MD   HYDROcodone-acetaminophen (NORCO) 10-325 MG per tablet Take 1 tablet by mouth every 6 hours as needed for Pain.Marland Kitchen    Historical Provider, MD   metoprolol (TOPROL-XL) 100 MG XL tablet Take 50 mg by mouth nightly     Historical Provider, MD   citalopram (CELEXA) 40 MG tablet Take 40 mg by mouth nightly     Historical Provider, MD   thyroid (ARMOUR) 15 MG tablet Take 15 mg by mouth nightly     Historical Provider, MD   amLODIPine (NORVASC) 10 MG tablet Take 10 mg by mouth nightly     Historical Provider, MD       Current medications:    No current facility-administered medications for this encounter.      Current Outpatient Prescriptions   Medication Sig Dispense Refill   . allopurinol (ZYLOPRIM) 300 MG tablet TAKE 1 TABLET BY MOUTH EVERY DAY  at night  3   . omeprazole (PRILOSEC) 20 MG delayed release capsule Take 20 mg by mouth nightly      . HYDROcodone-acetaminophen (NORCO) 10-325 MG per tablet Take 1 tablet by mouth every 6 hours as needed for Pain..     . metoprolol (TOPROL-XL) 100 MG XL tablet Take 50 mg by mouth nightly      . citalopram (CELEXA) 40 MG tablet Take 40 mg by mouth nightly      . thyroid (ARMOUR) 15 MG tablet Take 15 mg by mouth nightly      . amLODIPine (NORVASC) 10 MG tablet Take 10 mg by mouth nightly          Allergies:    Allergies   Allergen  Reactions   . Bee Venom Anaphylaxis       Problem List:  There is no problem list on file for this patient.      Past Medical History:        Diagnosis Date   . Anxiety    . Arthritis    . GERD (gastroesophageal reflux disease)    . Hypertension    . Thyroid disease        Past Surgical History:        Procedure Laterality Date   . CHOLECYSTECTOMY     . HYSTERECTOMY     . THYROID SURGERY Left 2000       Social History:    Social History   Substance Use Topics   . Smoking status: Current Every Day Smoker  Packs/day: 0.50     Types: Cigarettes   . Smokeless tobacco: Never Used   . Alcohol use No                                Ready to quit: Not Answered  Counseling given: Not Answered      Vital Signs (Current): There were no vitals filed for this visit.                                           BP Readings from Last 3 Encounters:   01/29/18 130/78   05/25/15 124/62       NPO Status:                                                                                 BMI:   Wt Readings from Last 3 Encounters:   01/29/18 212 lb (96.2 kg)   01/10/18 210 lb (95.3 kg)   11/15/17 200 lb (90.7 kg)     There is no height or weight on file to calculate BMI.    CBC:   Lab Results   Component Value Date    WBC 8.1 01/29/2018    RBC 5.00 01/29/2018    HGB 14.4 01/29/2018    HCT 43.7 01/29/2018    MCV 87.4 01/29/2018    RDW 13.8 01/29/2018    PLT 308 01/29/2018       CMP:   Lab Results   Component Value Date    NA 139 01/29/2018    K 4.0 01/29/2018    CL 100 01/29/2018    CO2 26 01/29/2018    BUN 15 01/29/2018    CREATININE 0.5 01/29/2018    GFRAA >60 01/29/2018    LABGLOM >60 01/29/2018    GLUCOSE 106 01/29/2018    PROT 7.7 05/25/2015    CALCIUM 9.6 01/29/2018    BILITOT <0.2 05/25/2015    ALKPHOS 96 05/25/2015    AST 12 05/25/2015    ALT 17 05/25/2015       POC Tests: No results for input(s): POCGLU, POCNA, POCK, POCCL, POCBUN, POCHEMO, POCHCT in the last 72 hours.    Coags:   Lab Results   Component Value Date    PROTIME 11.5  01/29/2018    INR 1.0 01/29/2018    APTT 34.5 01/29/2018       HCG (If Applicable): No results found for: PREGTESTUR, PREGSERUM, HCG, HCGQUANT     ABGs: No results found for: PHART, PO2ART, PCO2ART, HCO3ART, BEART, O2SATART     Type & Screen (If Applicable):  No results found for: LABABO, LABRH    Anesthesia Evaluation  Patient summary reviewed no history of anesthetic complications:   Airway: Mallampati: I  TM distance: >3 FB   Neck ROM: full  Mouth opening: > = 3 FB Dental: normal exam         Pulmonary: breath sounds clear to auscultation  (+) current smoker  Cardiovascular:    (+) hypertension: mild,         Rhythm: regular  Rate: normal                    Neuro/Psych:   (+) depression/anxiety             GI/Hepatic/Renal:   (+) GERD: well controlled,           Endo/Other:    (+) : arthritis: OA., .                 Abdominal:   (+) obese,         Vascular: negative vascular ROS.                                     Anesthesia Plan      spinal     ASA 2       Induction: intravenous.          Plan discussed with CRNA.                Trena PlattFrederick A Peachman, MD   01/29/2018 9:41 AM  Pat review. Pt requests GA. Agrees t o AC Block.  Frederick A Peachman    Pt seen, examined, chart reviewed, plan discussed.  No changes since seen in PAT.  Helen HashimotoSteven L Surgcenter Pinellas LLCheakoski  02/06/2018  11:24 AM

## 2018-02-06 NOTE — H&P (Addendum)
Updated h&P    Chief Complaint   Patient presents with   . Knee Pain     Left knee pain continues.  Patient states she continues to have issues and would like to discuss surgical procedures.       Subjective:     Patient ID: Emma Pena is a 52 y.o.. Caucasian female    Knee Pain  Patient is following up today with her left knee pain.  She states it has increased and she is having difficulties with ADL's.  She would like to discuss further surgical intervenetion.    PastMedicalHistory        Past Medical History:   Diagnosis Date   . Anxiety    . Hypertension    . Thyroid disease         PastSurgicalHistory   Past Surgical History:   Procedure Laterality Date   . CHOLECYSTECTOMY     . HYSTERECTOMY     . THYROID SURGERY  2000          CurrentMedication     Current Outpatient Prescriptions:   .  HYDROcodone-acetaminophen (NORCO) 10-325 MG per tablet, Take 1 tablet by mouth every 6 hours as needed for Pain.., Disp: , Rfl:   .  terbinafine (LAMISIL) 250 MG tablet, Take 250 mg by mouth daily, Disp: , Rfl:   .  metoprolol (TOPROL-XL) 100 MG XL tablet, Take 100 mg by mouth daily, Disp: , Rfl:   .  citalopram (CELEXA) 40 MG tablet, Take 40 mg by mouth daily, Disp: , Rfl:   .  hydrochlorothiazide (HYDRODIURIL) 12.5 MG tablet, Take 12.5 mg by mouth daily, Disp: , Rfl:   .  thyroid (ARMOUR) 15 MG tablet, Take 15 mg by mouth daily, Disp: , Rfl:   .  amLODIPine (NORVASC) 10 MG tablet, Take 10 mg by mouth daily, Disp: , Rfl:   .  pantoprazole (PROTONIX) 20 MG tablet, Take 1 tablet by mouth daily, Disp: 30 tablet, Rfl: 0     Allergies   Allergen Reactions   . Bee Venom Anaphylaxis     SocialHistory   Social History           Social History   . Marital status: Single     Spouse name: N/A   . Number of children: N/A   . Years of education: N/A         Occupational History   . Not on file.          Social History Main Topics   . Smoking status: Current Every Day Smoker   . Smokeless tobacco: Not on  file   . Alcohol use No   . Drug use: No   . Sexual activity: No          Other Topics Concern   . Not on file         Social History Narrative   . No narrative on file        FamilyHistory   History reviewed. No pertinent family history.         REVIEW OF SYSTEMS:     General/Constitution:  (-)weight loss, (-)fever, (-)chills, (-)weakness.   Skin: (-) rash,(-) psoriasis,(-) eczema, (-)skin cancer.   Musculoskeletal: (-) fractures,  (-) dislocations,(-) collagen vascular disease, (-) fibromyalgia, (-) multiple sclerosis, (-) muscular dystrophy, (-) RSD,(-) joint pain (-)swelling, (-) joint pain,swelling.  Neurologic: (-) epilepsy, (-)seizures,(-) brain tumor,(-) TIA, (-)stroke, (-)headaches, (-)Parkinson disease,(-) memory loss, (-) LOC.  Cardiovascular: (-) Chest pain, (-)  swelling in legs/feet, (-) SOB, (-) cramping in legs/feet with walking.  Respiratory: (-) SOB, (-) Coughing, (-) night sweats.  GI: (-) nausea, (-) vomiting, (-) diarrhea, (-) blood in stool, (-) gastric ulcer.  Psychiatric: (-) Depression, (-) Anxiety, (-) bipolar disease, (-) Alzheimer's Disease  Allergic/Immunologic: (-) allergies latex, (-) allergies metal, (-) skin sensitivity.  Hematlogic: (-) anemia, (-) blood transfusion, (-) DVT/PE, (-) Clotting disorders      Subjective:  BP (!) 144/80   Pulse 67   Temp 98.2 F (36.8 C) (Temporal)   Resp 16   Ht 5\' 6"  (1.676 m)   Wt 214 lb (97.1 kg)   LMP 05/25/2015   SpO2 96%   BMI 34.54 kg/m     Vital signs are stable. In general, patientis awake, alert and oriented X3, in no apparent distress. Examination ofHENT reveals normocephalic, atraumatic. PERRLA/EOMI sclera are white. Conjunctivae are clear. TM's are intact. Pharynx is pink and moist. Uvula and tongue are midline. Heart: Positive S1 and positive S2 with regular rate and rhythm. Lungs: Clear to auscultation bilaterally without rales, rhonchi or wheezes. Abdomen: soft, nontender. Positive bowel sounds. No  organomegaly. No guarding or rigidity.            Psycihatric:  The patient is alert and oriented x 3, appears to be stated age and in no distress.      Respiratory:  Respiratory effort is not labored.  Patient is not gasping.  Palpation of the chest reveals no tactile fremitus.    Skin:  Upon inspection: the skin appears warm, dry and intact.  There is  a previous scar over the affected area.There is any cellulitis, lymphedema or cutaneous lesions noted in the lower extremities.   Upon palpation there is no induration noted.      Neurologic:  Gait: antalgic;  Motor exam of the lower extremities show ; quadriceps, hamstrings, foot dorsi and plantar flexors intact R.  5/5 and L. 5/5. Deep tendon reflexes are 2/4 at the knees and 2/4 at the ankles with strong extensor hallicus longus motor strength bilaterally. Sensory to both feet is intact to all sensory roots.    Cardiovascular:  The vascular exam is normal and is well perfused to distal extremities.  Distal pulses DP/PT: R. 2+; L. 2+.  There is cap refill noted less than two seconds in all digits. There is not edema of the bilateral lower extremities.  There is not varicosities noted in the distal extremities.      Lymph:  Upon palpation,  there is no lymphadenopathy noted in bilateral lower extremities.      Musculoskeletal:  Gait: antalgic; examination of the nails and digits reveal no cyanosis or clubbing.    Lumbar exam:  On visual inspection, there is not deformity of the spine.   full range of motion, no tenderness, palpable spasm or pain on motion. Special tests: Straight Leg Raise negative, Faber test negative.      Hip exam:   Upon inspection, there is not deformity noted.  Upon palpation there is not tenderness.  ROM: is  full and symmetrical.   Strength: Hip Flexors 5/5; Hip Abductors 5/5; Hip Adduction 5/5.     Knee exam:  Left knee exam shows;  range of motion of R. Knee is 0 to 120, and L. Knee is 0 to 115. The patient does have  pain  on motion, effusion is mild, there is tenderness over the  medial region, there are not any masses, there is  not ligamentous instability, there is not  deformity noted.    Knee exam: left positive for moderate crepitations, some mild tenderness laxity is not noted with  stress.  There is not a popliteal cyst.    R. Knee:  Lachman's negative, Anterior Drawer negative, Posterior Drawer negative  McMurray's negative, Thallasy  negative,   PF grind test negative, Apprehension test negative, Patellar J sign  negative  L. Knee:  Lachman's negative, Anterior Drawer negative, Posterior Drawer negative  McMurray's positive, Thallasy  positive,   PF grind test negative, Apprehension test negative,  Patellar J sign  Negative    Xray:  Moderate tricompartmental degenerative changes are noted,   greatest in the medial compartment. There is no joint effusion. There   is normal alignment. The patella is well located. No fractures are   identified.         MRI:  Medial meniscus tear, moderate-severe medial joint narrowing  Radiographic findings reviewed with patient    Assessment:  djd  Left knee    Plan:  I had a lengthy discussion with the patient regarding their diagnosis. I explained treatment options including surgical vs non surgical treatment. I reviewed in detail the risks and benefits and outlined the procedure in detail with expected outcomes and possible complications.  I also discussed non surgical treatment such as injections (CSI and visco supplementation), physical therapy, topical creams and NSAID's. They have elected for surgical management at this time.   We discussed various treatment options both surgical and non-surgical.   The patient is unable to ambulate more than 100 feet and is unable to perform the average daily activities including:  Light housework, ADLs, donning clothes, toileting and exercise.  Patient has failed previous conservative measures including cortisone injections, NSAIDs, PT, HEP  and pain medication and is currently a fall risk to the disability and decreased functioning.  The patient wishes to have the total knee arthroplasty.The risks and benefits of a total knee replacement were discussed with the patient.  The risks include but are not limited to: infection, injury to blood vessels and nerves, non relief of symptoms, arthrofibrosis of knee, aseptic loosening of prosthesis, intraoperative fracture, blood loss, PE/DVT, MI, dislocation of hip and knee, need for further operative intervention and death.  The patient is aware of the risks and wished to proceed with a Left total knee replacement .

## 2018-02-06 NOTE — Progress Notes (Signed)
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  She reported that she will be sleeping in recliner until able to complete steps to bedroom.  Bathroom setup: tub/shower w/ tub seat   Equipment owned: Handicapped toilet; BSC    Prior Level of Function: Independent with ADLs , Independent with IADLs; ambulated Independent  Driving: Yes  Occupation: Works at grocery store    Pain Level: Minimal c/o pain - Patient able to participate in OTeval  Cognition: A&O: 4/4; Follows 1 step directions   Memory:  good   Sequencing:  fair   Problem solving:  fair   Judgement/safety:  fair     Functional Assessment:   Initial Eval Status  Date: 02/06/18 Treatment Status  Date: Short Term Goals  Treatment frequency: PRN   Feeding Independent      Grooming Supervision /setup - seated  Independent    UB Dressing MinA/setup   Supervision    LB Dressing Maximal Assist   Contact Guard Assist w/ use of AE PRN - Patient provide w/ reacher sock aid, long sponge and  Shoe horn    Bathing Moderate Assist  Stand by Assist    Toileting Maximal Assist   Stand by Assist    Bed Mobility  Supine to sit: Minimal Assist   Sit to supine: Minimal Assist   Supine to sit: Supervision   Sit to supine: Supervision    Functional Transfers Minimal Assist sit to stand from bed surface  Stand by Assist    Functional Mobility Minimal Assist -  cues for walker sequence  Stand by Assist    Balance Sitting:     Static:  Supervision    Dynamic:NT  Standing: CGA  Patient will demonstrate good standing tolerance for completion of ADL tasks   Activity Tolerance Fair-  Increase activity tolerance to good for completion of ADL tasks   Visual/  Perceptual Glasses: No             Strength ROM Additional Info:    RUE  5/5  WFL good grip and wfl FMC/dexterity noted during ADL tasks       LUE 5/5  WFL good grip and wfl FMC/dexterity noted during ADL tasks         Hearing: [facility]   Sensation:  No c/o numbness or tingling   Tone: WFL   Edema: Minimal swelling L LE; Support hose in place                            Comments/Treatment: Upon arrival, patient . Supine in bed.  She participated in functional transfer/mobility.  Patient introduced to AE for Lower body ADL. At end of session,  patient returned to bed  with call light and phone within reach, all lines and tubes intact.  Pt would benefit from continued skilled OT to increase safety and independence with completion of ADL/IADL tasks for functional independence and quality of life.    Eval Complexity: Low    Assessment of current deficits   Functional mobility [x]   ADLs [x]  Strength [x]   Cognition []   Functional transfers  [x]  IADLs [x]  Safety Awareness [x]   Endurance [x]   Fine Motor Coordination []  Balance [x]  Vision/perception []  Sensation []    Gross Motor Coordination []  ROM []  Delirium []                   Motor Control []     Plan of Care:   ADL retraining [x]    Equipment needs [x]    Neuromuscular re-education [x]  Energy Conservation Techniques [x]   Functional Transfer training [x]  Patient and/or Family Education [x]   Functional Mobility training [x]   Environmental Modifications [x]   Cognitive re-training []    Compensatory techniques for ADLs [x]   Splinting Needs []    Positioning to improve overall function [x]    Therapeutic Activity [x]   Therapeutic Exercise  [x]   Visual/Perceptual: []     Delirium  prevention/treatment  []    Other:  []     Rehab Potential: Good for established goals     Patient / Family Goal: Return to parents home/increase independence      Patient  instructed on functional diagnosis, prognosis/goals and OT plan of care. Demonstrated good understanding.        low Evaluation   Tx Time in: 445p  Tx Time out: 515p    Evaluation time includes thorough review of current medical information, gathering information on past medical history/social history and prior level of function, completion of standardized testing/informal observation of tasks, assessment of data, and development of POC/Goals      Jeanice LimDanielle Shakeria Robinette OTR/L; [id number]

## 2018-02-06 NOTE — Progress Notes (Signed)
@   12:18 pre-procedure  timeout per Dr. Junius CreamerSheakoski and B. Norvin Ohlin  @1220  post- procedure timeout completed Emma Pena tolerated the procedure well with complication or discomfort

## 2018-02-06 NOTE — Anesthesia Post-Procedure Evaluation (Signed)
Department of Anesthesiology  Postprocedure Note    Patient: Emma Pena  MRN: [id number]  Birthdate: February 01, 1966  Date of evaluation: 02/06/2018  Time:  3:21 PM     Procedure Summary     Date:  02/06/18 Room / Location:  [facility] OR 02 / [facility] OR    Anesthesia Start:  1251 Anesthesia Stop:  1516    Procedure:  LEFT KNEE TOTAL ARTHROPLASTY (VISIONAIRE) (Left Knee) Diagnosis:  (LEFT KNEE DJD)    Surgeon:  Girtha HakeWilliam B Woods, DO Responsible Provider:  Sonny MastersSteven L Sheakoski, MD    Anesthesia Type:  spinal ASA Status:  2          Anesthesia Type: spinal    Aldrete Phase I: Aldrete Score: 10    Aldrete Phase II:      Last vitals: Reviewed and per EMR flowsheets.       Anesthesia Post Evaluation    Patient location during evaluation: PACU  Patient participation: complete - patient participated  Level of consciousness: awake  Pain score: 0  Airway patency: patent  Nausea & Vomiting: no nausea  Complications: no  Cardiovascular status: blood pressure returned to baseline  Respiratory status: acceptable  Hydration status: euvolemic

## 2018-02-06 NOTE — Consults (Signed)
Name:  Emma Pena  DOB:  [birth year]  MRN:  [id number]  Room:  0317/0317-01  DOS:  02/06/2018    [facility]  Internal Medicine  -Resident Consult Note-    PCP:  Smitty Cords, DO  Admitting Physician:  Girtha Hake, DO  Consultants:  None at this time   Chief Complaint:   No chief complaint on file.      History of Present Illness  Emma Pena is a 52 y.o. female who presents to [facility] for left knee arthroplasty.  Medical history is pertinent for hypertension, hypothyroidism, GERD, and anxiety.  Patient is seen resting comfortably in bed with no complaints of pain.     Imaging Studies  X-ray Chest Pa And Lateral    Result Date: 01/29/2018  LOCATION: 100 EXAM: XR CHEST (2 VW) COMPARISON: None. HISTORY: Shortness of breath. TECHNIQUE: PA and lateral views of the chest were obtained. FINDINGS:  SUPPORT DEVICES: None. LUNGS: Subtle increased density right perihilar region only seen on the frontal view likely reflects summation of shadows. No lung nodules. PLEURA: No effusions or pneumothorax. LUNG VOLUMES: Satisfactory inspirator effort. MEDIASTINAL STRUCTURES: No lymphadenopathy. Normal aortic contour. HEART SIZE: Normal. UPPER ABDOMEN: Unremarkable. BONES AND SOFT TISSUES: No fracture or soft tissue abnormality.     1. No active cardiopulmonary disease.     Xr Knee Left (1-2 Views)    Result Date: 02/06/2018  Reading location:  200 Indication: Left knee arthroplasty. Comparison: Left knee radiograph from 11/15/2017. Technique: 2 views of the left knee were obtained. Findings: There are postsurgical changes from interval left total knee arthroplasty, which is in anatomic alignment. There is no evidence of periprosthetic fracture. There are scattered intra-articular and subcutaneous gas. Anterior skin staples are noted.     Post left total knee arthroplasty without evidence of immediate hardware complication or malalignment.     Histories  Past Medical History:   Diagnosis Date   . Anxiety     . Arthritis    . GERD (gastroesophageal reflux disease)    . Hypertension    . Thyroid disease      Past Surgical History:   Procedure Laterality Date   . CHOLECYSTECTOMY     . HYSTERECTOMY     . THYROID SURGERY Left 2000     No family history on file.    Home Medications  Prior to Admission medications    Medication Sig Start Date End Date Taking? Authorizing Provider   oxyCODONE-acetaminophen (PERCOCET) 5-325 MG per tablet Take 1 tablet by mouth every 8 hours as needed for Pain for up to 7 days. Intended supply: 7 days. Take lowest dose possible to manage pain. 02/06/18 02/13/18 Yes Blenda Peals, DO   aspirin 325 MG EC tablet Take 1 tablet by mouth 2 times daily for 28 days 02/06/18 03/06/18 Yes Blenda Peals, DO   celecoxib (CELEBREX) 100 MG capsule Take 1 capsule by mouth 2 times daily for 14 days 02/06/18 02/20/18 Yes Blenda Peals, DO   gabapentin (NEURONTIN) 100 MG capsule Take 1 capsule by mouth 3 times daily for 14 days.. 02/06/18 02/20/18 Yes Blenda Peals, DO   allopurinol (ZYLOPRIM) 300 MG tablet TAKE 1 TABLET BY MOUTH EVERY DAY  at night 01/03/18  Yes Historical Provider, MD   omeprazole (PRILOSEC) 20 MG delayed release capsule Take 20 mg by mouth nightly    Yes Historical Provider, MD   metoprolol (TOPROL-XL) 100 MG XL tablet Take 50  mg by mouth nightly    Yes Historical Provider, MD   citalopram (CELEXA) 40 MG tablet Take 40 mg by mouth nightly    Yes Historical Provider, MD   thyroid (ARMOUR) 15 MG tablet Take 15 mg by mouth nightly    Yes Historical Provider, MD   amLODIPine (NORVASC) 10 MG tablet Take 10 mg by mouth nightly    Yes Historical Provider, MD   HYDROcodone-acetaminophen (NORCO) 10-325 MG per tablet Take 1 tablet by mouth every 6 hours as needed for Pain.Marland Kitchen.    Historical Provider, MD       Allergies  Bee venom    Social History  Social History     Social History   . Marital status: Single     Spouse name: N/A   . Number of children: N/A   . Years of education: N/A     Occupational  History   . Not on file.     Social History Main Topics   . Smoking status: Current Every Day Smoker     Packs/day: 0.50     Types: Cigarettes   . Smokeless tobacco: Never Used   . Alcohol use No   . Drug use: No   . Sexual activity: No     Other Topics Concern   . Not on file     Social History Narrative   . No narrative on file       Review of Systems  All bolded are positive; please see HPI  General:  Fever, chills, diaphoresis, fatigue, malaise, night sweats, weight loss  Psychological:  Anxiety, disorientation, hallucinations.  ENT:  Epistaxis, vertigo, visual changes.  Cardiovascular:  Chest pain, irregular heartbeats, palpitations, paroxysmal nocturnal dyspnea.  Respiratory:  Shortness of breath, coughing, sputum production, hemoptysis, wheezing, orthopnea.  Gastrointestinal:  Nausea, vomiting, diarrhea, heartburn, constipation, abdominal pain, hematemesis, hematochezia, melena, acholic stools  Genito-Urinary:  Dysuria, urgency, frequency, hematuria  Musculoskeletal:  Joint pain, joint stiffness, joint swelling, muscle pain  Neurology:  Headache, focal neurological deficits, weakness, numbness, paresthesia  Derm:  Rashes, ulcers, excoriations, bruising  Extremities:  Decreased ROM, peripheral edema, mottling    Physical Examination  Vitals:  BP 114/67   Pulse 60   Temp 98.9 F (37.2 C) (Axillary)   Resp 16   Ht 5\' 6"  (1.676 m)   Wt 214 lb (97.1 kg)   LMP 05/25/2015   SpO2 96%   BMI 34.54 kg/m   General Appearance:  awake, alert, and oriented to person, place, time, and purpose; appears stated age and cooperative; no apparent distress no labored breathing  HEENT:  NCAT; PERRL; conjunctiva pink, sclera clear  Neck:  no adenopathy, bruit, JVD, tenderness, masses, or nodules; supple, symmetrical, trachea midline, thyroid not enlarged  Lung:  clear to auscultation bilaterally; no use of accessory muscles; no rhonchi, rales, or crackles  Heart:  regular rate and regular rhythm without murmur, rub, or  gallop  Abdomen:  soft, nontender, nondistended; normoactive bowel sounds; no organomegaly  Extremities:  extremities normal, atraumatic, no cyanosis or edema  Musculokeletal:  no joint swelling, no muscle tenderness. ROM normal in all joints of extremities, with the exception of the postop knee.   Neurologic:  mental status A&Ox3, thought content appropriate; CN II-XII grossly intact; sensation intact, motor strength 5/5 globally; no slurred speech  Osteopathic:  Patient examined in seated position; normal lumbar lordosis and thoracic kyphosis; no TART changes to paraspinal musculature    Imaging results and pertinent abnormal laboratory findings are  outlined above in ED course; for further results, please consult the electronic medical record.         Assessment and Plan  Patient is a 52 y.o. female who presented with left knee arthroplasty.   The active problem list is as follows:    S/p left knee arthroplasty  Hypertension  Hypothyroidism  GERD  Anxiety    Patient underwent left knee arthroplasty today and has been admitted with consult to medical management.  Home medications will be reviewed.  Pain control per orthopedics. Routine labs in the morning     DVT prophylaxis.   Please see orders for further management and care.    The plan was discussed with Dr. Luan Pulling.    Ann Held, DO PGY2  02/06/2018  6:30 PM    The chart was reviewed and the patient was seen and examined.The case was discussed in detail with the resident and agree with current impressions and plan. Patient denies any chest pain dizziness nausea.    Farrel Gobble, D.O.  7:17 PM  02/06/2018

## 2018-02-07 LAB — BASIC METABOLIC PANEL
Anion Gap: 13 mmol/L (ref 7–16)
BUN: 15 mg/dL (ref 6–20)
CO2: 23 mmol/L (ref 22–29)
Calcium: 9 mg/dL (ref 8.6–10.2)
Chloride: 93 mmol/L — ABNORMAL LOW (ref 98–107)
Creatinine: 0.5 mg/dL (ref 0.5–1.0)
GFR African American: >60
GFR Non-African American: >60 mL/min/{1.73_m2} (ref >=60)
Glucose: 139 mg/dL — ABNORMAL HIGH (ref 74–99)
Potassium: 4.5 mmol/L (ref 3.5–5.0)
Sodium: 129 mmol/L — ABNORMAL LOW (ref 132–146)

## 2018-02-07 LAB — HEMOGLOBIN AND HEMATOCRIT
Hematocrit: 36.8 % (ref 34.0–48.0)
Hemoglobin: 12.1 g/dL (ref 11.5–15.5)

## 2018-02-07 MED ORDER — AMLODIPINE BESYLATE 5 MG PO TABS
5 MG | Freq: Every evening | ORAL | Status: DC
Start: 2018-02-07 — End: 2018-02-07
  Administered 2018-02-07: 01:00:00 5 mg via ORAL

## 2018-02-07 MED FILL — ALLOPURINOL 300 MG PO TABS: 300 mg | ORAL | Qty: 1

## 2018-02-07 MED FILL — THERA M PLUS PO TABS: ORAL | Qty: 1

## 2018-02-07 MED FILL — METOPROLOL SUCCINATE ER 50 MG PO TB24: 50 mg | ORAL | Qty: 1

## 2018-02-07 MED FILL — OXYCODONE-ACETAMINOPHEN 10-325 MG PO TABS: 10-325 mg | ORAL | Qty: 1

## 2018-02-07 MED FILL — CEFAZOLIN SODIUM 1 G IJ SOLR: 1 g | INTRAMUSCULAR | Qty: 2000

## 2018-02-07 MED FILL — NP THYROID 30 MG PO TABS: 30 mg | ORAL | Qty: 1

## 2018-02-07 MED FILL — XARELTO 10 MG PO TABS: 10 mg | ORAL | Qty: 1

## 2018-02-07 MED FILL — AMLODIPINE BESYLATE 5 MG PO TABS: 5 mg | ORAL | Qty: 1

## 2018-02-07 MED FILL — MORPHINE SULFATE 4 MG/ML IJ SOLN: 4 mg/mL | INTRAMUSCULAR | Qty: 1

## 2018-02-07 MED FILL — CELECOXIB 100 MG PO CAPS: 100 mg | ORAL | Qty: 2

## 2018-02-07 MED FILL — DOK 100 MG PO CAPS: 100 mg | ORAL | Qty: 1

## 2018-02-07 MED FILL — STERILE WATER FOR INJECTION IJ SOLN: INTRAMUSCULAR | Qty: 20

## 2018-02-07 MED FILL — PANTOPRAZOLE SODIUM 40 MG PO TBEC: 40 mg | ORAL | Qty: 1

## 2018-02-07 MED FILL — GABAPENTIN 100 MG PO CAPS: 100 mg | ORAL | Qty: 1

## 2018-02-07 MED FILL — CITALOPRAM HYDROBROMIDE 20 MG PO TABS: 20 mg | ORAL | Qty: 2

## 2018-02-07 NOTE — Care Coordination-Inpatient (Signed)
SS Note:  Met with pt in PAT and post-op for d/c planning, reviewed rehab options and therapy recommendations, pt is from [place] but will be staying at her parents on d/c address: [address] [place], [place], [place], pt has a w/w and all dme needed, no [place] agency preference relayed, pt has [employer], referral made to [employer] for [facility] and face sheet faxed, agency checking if they are in network with pt's policy, nursing notified,sw to follow.Electronically signed by Carolee Rota, LSW on 02/07/2018 at 9:26 AM

## 2018-02-07 NOTE — Progress Notes (Signed)
[facility] Quality Flow/Interdisciplinary Rounds Progress Note        Quality Flow Rounds held on February 07, 2018    Disciplines Attending:  Bedside Nurse, Social Worker, Case Manager and Nursing Unit Leadership    Emma Pena was admitted on 02/06/2018 11:01 AM    Anticipated Discharge Date:  Expected Discharge Date: 02/07/18    Disposition:    Braden Score:  Braden Scale Score: 22    Readmission Risk              Risk of Unplanned Readmission:        6           Discussed patient goal for the day, patient clinical progression, and barriers to discharge.  The following Goal(s) of the Day/Commitment(s) have been identified:  Possible discharge home today      Emma Pena  February 07, 2018

## 2018-02-07 NOTE — Progress Notes (Signed)
Occupational Therapy  O.T. Bedside Treatment Note    Date:02/07/2018  Patient Name: Emma Pena  MRN: [id number]  DOB: [birth year]  ROOM #: 0317/0317-01    Problem list / Diagnosis:   Patient Active Problem List   Diagnosis   . Primary osteoarthritis of one knee, left   . Thyroid disease   . Hypertension   . Anxiety     Past Medical History:   Past Medical History:   Diagnosis Date   . Anxiety    . Arthritis    . GERD (gastroesophageal reflux disease)    . Hypertension    . Thyroid disease      Onset/Medical history: medical history reviewed  Past medical history: reviewed    The admitting diagnosis and active problem list as listed above have been reviewed prior to treatment. Nursing cleared the patient for treatment. Patient is agreeable to treatment.    Discharge recommendations: HHOT  Equipment prescriptions needed: none  Comment: pt provided with AE for LE dressing     AM - PAC Daily Activity Inpatient   AM-PAC Daily Activity Inpatient   How much help for putting on and taking off regular lower body clothing?: A Little  How much help for Bathing?: A Little  How much help for Toileting?: None  How much help for putting on and taking off regular upper body clothing?: None  How much help for taking care of personal grooming?: A Little  How much help for eating meals?: None  AM-PAC Inpatient Daily Activity Raw Score: 21  AM-PAC Inpatient ADL T-Scale Score : 44.27  ADL Inpatient CMS 0-100% Score: 32.79  ADL Inpatient CMS G-Code Modifier : CJ        Precautions: Falls, L TKA: LLE; WBAT    S:  - Pt cleared for treatment through nursing.  - Pain: pt c/o pain in L knee, however pt did not rate pain.Nsg aware and provided pt with pain medication prior to session.  - Pt pleasant and cooperative during session.    O:  Functional Assessment:   Initial Eval Status  Date: 02/06/18 Treatment Status  Date: 2/21 Short Term Goals  Treatment frequency: PRN   Feeding Independent  Independent  Pt able to bring cup to mouth     Grooming Supervision /setup - seated Supervision  While standing sinkside to wash hands after toileting Independent    UB Dressing MinA/setup  Per last report  Pt states she donned shirt prior to session Supervision    LB Dressing Maximal Assist  Min/mod a  Pt education on use of AE for LE dressing d/t decreased knee flexion; pt reports having assist to don pants prior to session; education on use of reacher to don pants; pt able to don/doff pants over B hips with Min a for balance while toileting Contact Guard Assist w/ use of AE PRN - Patient provide w/ reacher sock aid, long sponge and  Shoe horn    Bathing Mod  Assist Min A  Pt education, demonstration and participation with use of DME in clinic for bathroom safety/safe transfers.  Min Assist to support LLE to/from simulated tub/shower. pt provided with leg lifter to assist in transfers, as well     Stand by Assist    Toileting Maximal Assist  Min Assist; education on hand placement during transfer to/from toilet and assist for balance when donning/doffing pants over B hips during toileting; independent for hygiene when seated on low commode Stand by Assist  Bed Mobility  Supine to sit: Minimal Assist   Sit to supine: Minimal Assist  Mod I for supine to sit with use of leg lifter; scooting at supervision; pt in chair at end of session Supine to sit: Supervision   Sit to supine: Supervision    Functional Transfers Minimal Assist sit to stand from bed surface Supervision for sit to stand from EOB; cuing for hand placement; transfer to/from w/c and multiple surfaces in clinic at supervision; simulated car transfer at min A to support LLE to/from car; pt is going to slide across back seat of vehicle at D/C Stand by Assist    Functional Mobility Minimal Assist - cues for walker sequence Supervision with use of FWW; no LOB noted; 20 ft x 2 and 6 ft x 2 in clinic and 40 ft in hallway; cuing for walker advancement, sequencing Stand by Assist    Balance  Sitting:     Static:  Supervision    Dynamic:NT  Standing: CGA supervision Patient will demonstrate good standing tolerance for completion of ADL tasks   Activity Tolerance Fair- fair Increase activity tolerance to good for completion of ADL tasks   Visual/  Perceptual Glasses: No                A:  -Balance: Sitting: fair        Standing: fair  with Supervision  with FWW  -Endurance: fair   -Edema: yes - LLE; nsg aware  -Safety:fair  (VC's for walker safety, sequencing, hand placement, joint protection)  - Pt/family education/training: bed mobility, transfer training, functional mobility, AE for LE dressing, LE dressing technique, toileting/hygiene, sequencing, hand placement, walker safety, bathroom safety, DME, ECT's, joint protection techniques,  grooming, ADL retraining, self-feeding  -Pt would benefit from additional ADLs, safety education, balance activities, transfer training, strength exercises, and over all endurance building.    P:  - Plan of care: Patient will be D/C'd with HHOT PRN and family supervision/assist PRN.  - Patient and/or family understands diagnosis, prognosis and plan of care: yes       Treatment minutes: [address] [place]/L [phone]

## 2018-02-07 NOTE — Progress Notes (Addendum)
Department of Orthopedic Surgery  Resident Progress Note    Patient seen and examined. Pain controlled. No new complaints.  Denies chest pain, shortness of breath, calf pain, dizziness/lightheadedness. -BM, + flatulence. 475ft with PT yesterday without issues    VITALS:  BP (!) 147/71   Pulse 66   Temp 97.8 F (36.6 C) (Oral)   Resp 18   Ht 5\' 6"  (1.676 m)   Wt 214 lb (97.1 kg)   LMP 05/25/2015   SpO2 98%   BMI 34.54 kg/m     GENERAL: alert, awake, oriented  MUSCULOSKELETAL:   left lower extremity:   Pico dressing C/D/I   Ted hose on   Compartments soft and compressible, calf non-tender   Palpable dorsalis pedis and posterior tibialis pulse, brisk cap refill to toes, foot warm and perfused   Sensation intact to light touch in sural/deep peroneal/superficial peroneal/saphenous/posterior tibial nerve distributions to foot/ankle.   Demonstrates active ankle plantar/dorsiflexion/great toe extension    CBC:   Lab Results   Component Value Date    WBC 8.1 01/29/2018    HGB 12.1 02/07/2018    HCT 36.8 02/07/2018    PLT 308 01/29/2018       ASSESSMENT   L TKA 2/20    PLAN    WBAT  Pain control IV & PO  DVT prophylaxis- xarelto, early mobilization  Monitor Labs  Trend H&H- 12.1  24 hr post op ABX   PT/OT  D/C planning, SW/PT recs, ok for discharge home today follow PT and pain control   Discuss with attending

## 2018-02-07 NOTE — Discharge Instructions (Signed)
Your information:  Name: Emma Pena  DOB: 1966-08-19    Your instructions:  Discharge home with home care  They will call you to set up your first visit  Orthopedic Discharge Instructions:  Continue physical therapy.  Weight bear as tolerated surgical leg.  Take Asprin as prescribed for prevention of blood clots.  Take pain medication as prescribed.  Maintain Mepilex or Pico dressing for 7 days.  May remove and shower thereafter.  Keep covered with dry dressing until drainage ceases.  Follow up in office in 2 weeks with Dr. Joseph ArtWoods, call for appointment.  Signs and symptoms to watch out for:  Call 911 anytime you think you may need emergency care. For example, call if:    You passed out (lost consciousness).     You have severe trouble breathing.     You have sudden chest pain and shortness of breath, or you cough up blood.   Call your doctor now or seek immediate medical care if:    You have signs of infection, such as:  ? Increased pain, swelling, warmth, or redness.  ? Red streaks leading from the incision.  ? Pus draining from the incision.  ? A fever.     You have signs of a blood clot, such as:  ? Pain in your calf, back of the knee, thigh, or groin.  ? Redness and swelling in your leg or groin.     Your incision comes open and begins to bleed, or the bleeding increases.     You have pain that does not get better after you take pain medicine.   Watch closely for changes in your health, and be sure to contact your doctor if:    You do not have a bowel movement after taking a laxative.     What to do after you leave the hospital:    Recommended diet: regular diet    The following personal items were collected during your admission and were returned to you:    Valuables  Dentures: None  Vision - Corrective Lenses: None  Hearing Aid: None  Jewelry: None  Body Piercings Removed: N/A  Clothing: Pants, Jacket / coat, Footwear, Shirt  Were All Patient Medications Collected?: Not Applicable  Other  Valuables: None  Valuables Given To: Patient  Responsible person(s) in the waiting room?: parents nanct tim  Patient approves for provider to speak to responsible person post operatively: Yes    Information obtained by:  By signing below, I understand that if any problems occur once I leave the hospital I am to contact Dr. Joseph ArtWoods.  I understand and acknowledge receipt of the instructions indicated above.

## 2018-02-07 NOTE — Progress Notes (Signed)
Physical Therapy  PHYSICAL THERAPY  TREATMENT NOTE    02/07/2018  0317/0317-01                      Patient Name: Emma Pena      [id number]                              29-Dec-1965     Recommendation for discharge: HHPT  Equipment prescriptions needed: to be determined    AM-PAC Mobility Inpatient   How much difficulty turning over in bed?: A Little  How much difficulty sitting down on / standing up from a chair with arms?: A Little  How much difficulty moving from lying on back to sitting on side of bed?: A Little  How much help from another person moving to and from a bed to a chair?: A Little  How much help from another person needed to walk in hospital room?: A Little  How much help from another person for climbing 3-5 steps with a railing?: A Little  AM-PAC Inpatient Mobility Raw Score : 18  AM-PAC Inpatient T-Scale Score : 43.63  Mobility Inpatient CMS 0-100% Score: 46.58  Mobility Inpatient CMS G-Code Modifier : CK      Patient Active Problem List   Diagnosis   . Primary osteoarthritis of one knee, left   . Thyroid disease   . Hypertension   . Anxiety         Weight bearing/Precautions: falls, L TKA: FWB     S: Patient cleared by nursing for treatment.  Patient is agreeable to treatment. Pt c/o pain with L knee.  Pain status: (measured on a visual analog scale with 0=no pain and 10=excruciating pain) no number given/10.   O: Pt was instructed in and performed the following:   Bed Mobility-  Supine to sit-Supervision using leg lifter     Scooting- Supervision      Sit to supine-Supervision     Transfers-sit to stand- Minimal assists x 1    Gait:  Patient ambulated 50 feet using Wheeled Walker with Minimal assists x 1. Verbal cues required for sequencing, AD placement, safety, upright posture, weight bearing,  increased base of support, increased step length,     Steps: Pt ascended/descended 7 steps using 2 hand rails with Supervision. Pt ascended/descended 7 steps using 1 hand rail and 1 crutch with  Supervision. V/C for sequencing and safety.    Treatment: Pt performed seated ex's: ankle pumps, gluteal sets, long arc quad, seated knee flexion, hip abduction, hip adduction,   x 15 reps. V/C for technique. Pt ambulated in the hallway and performed stair training. Pt instructed on using a leg lifter for bed mobility, to assist with ex's flexion and extension, proper hand placement and increased knee flexion with each STS trf, and to elevate her L leg higher than her heart while sitting or in bed to decrease swelling and to use leg lifter for DF to increase her knee extension.    Family education/handouts: No family present during tx session.  Comment: Call light left by patient. RTB at end of tx session.    A: Pt needing minimal assist with walker placement and cueing for proper sequencing during ambulation. Pt able to perform stairs with no LOB.  P: Continue with physical therapy    Kassidy Dockendorf D Yamir Carignan, PTA   Goals to be met in 3 days.  Bed mobility-Supervision  Transfers-Supervision  Ambulation-Supervision for 100 feet using  wheeled walker  Stairs-up and down 4  step(s) using 1 rail(s) with Independent.    Comments:   Increase rom in affected joints by 10%  Increase strength in affected muscle groups by 1/3 grade  Increase balance to allow for improvement towards functional goals.  Increase endurance to allow for improvement towards functional goals.

## 2018-02-07 NOTE — Care Coordination-Inpatient (Addendum)
[facility]  Patient instructed to take them to the pharmacy of her choice.  Electronically signed by Orlie Dakinonna L Quantavia Frith, RN on 02/07/2018 at 9:19 AM

## 2018-02-07 NOTE — Care Coordination-Inpatient (Signed)
SS Note/Discharge plan:  Notified by [facility] nurse liaison for [facility] that they did verify that their agency is in network with pt's [employer] and have accepted referral, she will contact pt to arrange home care visits to start tomorrow 2/22, nursing notified.Electronically signed by Winferd Humphrey, LSW on 02/07/2018 at 11:58 AM

## 2018-02-07 NOTE — Progress Notes (Signed)
Patient seen and examined.  Patient has postop discomfort.  Patient's serum sodium is 129 with a preop sodium of 139.  Patient's fasting blood sugars 106 preop and was 130 today.  Both abnormalities most likely related to surgery.  Since blood pressure is stable.  No complaints of unusual SOB, nausea, vomiting, chest pain,abd. pain, dizziness, diarrhea or constipation.    BP (!) 147/71   Pulse 66   Temp 97.8 F (36.6 C) (Oral)   Resp 18   Ht 5\' 6"  (1.676 m)   Wt 214 lb (97.1 kg)   LMP 05/25/2015   SpO2 98%   BMI 34.54 kg/m     HEENT: negative to gross visual examination  Heart: regular rate and rhythm  Lungs: clear  Abdomen: soft, positive bowel sounds, no hepatosplenomegaly, no guarding, rebound, rigidity  Ext: no clubbing, cyanosis, erythema, + left knee edema   Neuro: alert and oriented. No gross deficits    CBC with Differential:    Lab Results   Component Value Date    WBC 8.1 01/29/2018    RBC 5.00 01/29/2018    HGB 12.1 02/07/2018    HCT 36.8 02/07/2018    PLT 308 01/29/2018    MCV 87.4 01/29/2018    MCH 28.8 01/29/2018    MCHC 33.0 01/29/2018    RDW 13.8 01/29/2018    LYMPHOPCT 18 05/25/2015    MONOPCT 7 05/25/2015    BASOPCT 0 05/25/2015    MONOSABS 0.90 05/25/2015    LYMPHSABS 2.26 05/25/2015    EOSABS 0.10 05/25/2015    BASOSABS 0.03 05/25/2015     CMP:    Lab Results   Component Value Date    NA 129 02/07/2018    K 4.5 02/07/2018    K 4.0 01/29/2018    CL 93 02/07/2018    CO2 23 02/07/2018    BUN 15 02/07/2018    CREATININE 0.5 02/07/2018    GFRAA >60 02/07/2018    LABGLOM >60 02/07/2018    GLUCOSE 139 02/07/2018    PROT 7.7 05/25/2015    LABALBU 4.6 05/25/2015    CALCIUM 9.0 02/07/2018    BILITOT <0.2 05/25/2015    ALKPHOS 96 05/25/2015    AST 12 05/25/2015    ALT 17 05/25/2015     Magnesium:  No results found for: MG  Phosphorus:  No results found for: PHOS  PT/INR:    Lab Results   Component Value Date    PROTIME 11.5 01/29/2018    INR 1.0 01/29/2018     Troponin:    Lab Results   Component  Value Date    TROPONINI <0.01 05/25/2015     U/A:    Lab Results   Component Value Date    COLORU Yellow 01/29/2018    PROTEINU Negative 01/29/2018    PHUR 5.5 01/29/2018    WBCUA 0-1 05/25/2015    RBCUA 0-1 05/25/2015    BACTERIA NONE 05/25/2015    CLARITYU Clear 01/29/2018    SPECGRAV 1.020 01/29/2018    LEUKOCYTESUR Negative 01/29/2018    UROBILINOGEN 0.2 01/29/2018    BILIRUBINUR Negative 01/29/2018    BLOODU Negative 01/29/2018    GLUCOSEU Negative 01/29/2018     HgBA1c:  No results found for: LABA1C  FLP:  No results found for: TRIG, HDL, LDLCALC, LDLDIRECT, LABVLDL  TSH:  No results found for: TSH  LIPASE:    Lab Results   Component Value Date    LIPASE 81 05/25/2015  No results for input(s): GLUMET in the last 72 hours.    XR KNEE LEFT (1-2 VIEWS)   Final Result   Post left total knee arthroplasty without evidence of immediate   hardware complication or malalignment.             . docusate sodium  100 mg Oral BID   . therapeutic multivitamin-minerals  1 tablet Oral Daily   . celecoxib  200 mg Oral Q12H   . gabapentin  100 mg Oral Q8H   . rivaroxaban  10 mg Oral Daily   . metoprolol succinate  50 mg Oral Nightly   . citalopram  40 mg Oral Nightly   . thyroid  15 mg Oral Nightly   . allopurinol  300 mg Oral Daily   . pantoprazole  40 mg Oral QAM AC   . amLODIPine  5 mg Oral Nightly        Assessment;  Active Problems:    Primary osteoarthritis of one knee, left    Thyroid disease    Mild hyponatremia postoperatively surgery    Hypertension    Anxiety        Plan:   The patient is clearly asymptomatic at this time.  The patient is hyperglycemic probably related to the surgical stress with a normal fasting blood sugar just days ago.  Okay to discharge home and follow-up with primary care physician.  See orders.        Farrel Gobbleouglas A Dearies Meikle, DO  9:46 AM  02/07/2018

## 2018-02-20 ENCOUNTER — Encounter

## 2018-02-21 ENCOUNTER — Ambulatory Visit: Admit: 2018-02-21 | Discharge: 2018-02-21 | Payer: PRIVATE HEALTH INSURANCE | Attending: Orthopaedic Surgery

## 2018-02-21 ENCOUNTER — Ambulatory Visit: Admit: 2018-02-21 | Discharge: 2018-02-21 | Payer: PRIVATE HEALTH INSURANCE

## 2018-02-21 DIAGNOSIS — M1712 Unilateral primary osteoarthritis, left knee: Secondary | ICD-10-CM

## 2018-02-21 NOTE — Progress Notes (Signed)
Subjective:     Post-Operative week: 2 Status Post left Total Knee Arthroplasty, surgery date 02/06/2018. Systemic or Specific Complaints:none    Objective:     General: alert, appears stated age and cooperative   Wound: Wound clean and dry no evidence of infection., No Erythema, No Edema and No Drainage   Motion: Flexion: 0 to 90 Degrees   DVT Exam: No evidence of DVT seen on physical exam.  Negative Homan's sign.  No cords or calf tenderness.  No significant calf/ankle edema.     Imaging:  Xrays:  No signs of fracture, there is good alignment, and no signs of aseptic loosening.   Radiographic findings reviewed with patient    Assessment:     Encounter Diagnosis   Name Primary?   ??? Primary osteoarthritis of one knee, left Yes      Doing well postoperatively.     Plan:     Continues current post-op course, staples were removed at today's appointment  Patient is to continue taking enteric coated aspirin 81 mg, 1 tablet twice daily.    Patient is to continue wearing TED hose, on during the day and off at night.    Outpatient physical therapy is to begin immediately.    No bathing/swimming/hot tub for two weeks or until incision is completely closed.    We will see the patient back in 2 weeks for repeat xray and evaluation.  Please call office with any questions or concerns at [phone]

## 2018-03-08 ENCOUNTER — Encounter

## 2018-03-08 ENCOUNTER — Ambulatory Visit: Admit: 2018-03-08 | Discharge: 2018-03-08 | Payer: PRIVATE HEALTH INSURANCE | Attending: Adult Health

## 2018-03-08 DIAGNOSIS — Z96652 Presence of left artificial knee joint: Secondary | ICD-10-CM

## 2018-03-08 NOTE — Progress Notes (Signed)
Post-Operative week:4  Status Post left Total Knee Arthroplasty, DOS: 02/06/18  Systemic or Specific Complaints:No Complaints    Objective:     General: alert, appears stated age and cooperative   Wound: Wound clean and dry no evidence of infection., No Erythema, No Edema and No Drainage   Motion: Flexion: 0 to 95 Degrees   DVT Exam: No evidence of DVT seen on physical exam.  Negative Homan's sign.  No cords or calf tenderness.  No significant calf/ankle edema.       Xrays:  None      Assessment:     Encounter Diagnosis   Name Primary?   ??? S/P total knee arthroplasty, left Yes      Doing well postoperatively.     Plan:     Continues current post-op course  Patient is to discontinue taking enteric coated aspirin 325mg  at 6 weeks post op    Patient is to discontinue wearing TED hose at 6 weeks post op  Continue with outpatient physical therapy.    Follow up 1 months

## 2018-04-03 ENCOUNTER — Encounter

## 2018-04-04 ENCOUNTER — Ambulatory Visit: Admit: 2018-04-04 | Discharge: 2018-04-04 | Payer: PRIVATE HEALTH INSURANCE

## 2018-04-04 ENCOUNTER — Ambulatory Visit: Admit: 2018-04-04 | Discharge: 2018-04-04 | Payer: PRIVATE HEALTH INSURANCE | Attending: Orthopaedic Surgery

## 2018-04-04 DIAGNOSIS — Z96652 Presence of left artificial knee joint: Secondary | ICD-10-CM

## 2018-04-04 NOTE — Telephone Encounter (Signed)
Patient called stating that she is still waiting to be seen by her pcp. She may be a few minutes late for her appointment with Dr Joseph Art.

## 2018-04-04 NOTE — Progress Notes (Signed)
Post-Operative week:4  Status Post left Total Knee Arthroplasty, DOS: 02/06/18  Systemic or Specific Complaints:No Complaints    Objective:     General: alert, appears stated age and cooperative   Wound: Wound clean and dry no evidence of infection., No Erythema, No Edema and No Drainage   Motion: Flexion: 0 to 112 Degrees   DVT Exam: No evidence of DVT seen on physical exam.  Negative Homan's sign.  No cords or calf tenderness.  No significant calf/ankle edema.       Xrays:  None      Assessment:     No diagnosis found.   Doing well postoperatively.     Plan:     [place] left lower leg today  Continue PT/HEP  Activity as tolerated  FU in 2 months with xr

## 2018-06-06 ENCOUNTER — Encounter

## 2018-06-06 ENCOUNTER — Encounter: Attending: Orthopaedic Surgery

## 2018-06-19 ENCOUNTER — Encounter

## 2018-07-03 ENCOUNTER — Encounter

## 2018-07-04 ENCOUNTER — Encounter: Attending: Orthopaedic Surgery

## 2018-07-05 ENCOUNTER — Ambulatory Visit: Admit: 2018-07-05 | Discharge: 2018-07-05 | Payer: PRIVATE HEALTH INSURANCE

## 2018-07-05 ENCOUNTER — Ambulatory Visit: Admit: 2018-07-05 | Discharge: 2018-07-05 | Payer: PRIVATE HEALTH INSURANCE | Attending: Adult Health

## 2018-07-05 DIAGNOSIS — Z96652 Presence of left artificial knee joint: Secondary | ICD-10-CM

## 2018-07-05 NOTE — Progress Notes (Signed)
Chief Complaint   Patient presents with   ??? Knee Pain     Left TKA, DOS 02/06/18, states of swelling.       Emma Pena returns today for follow-up of her left TKA. DOS: 02/06/18. She reports this is better than when I saw the patient last.      Past Medical History:   Diagnosis Date   ??? Anxiety    ??? Arthritis    ??? GERD (gastroesophageal reflux disease)    ??? Hypertension    ??? Thyroid disease      Past Surgical History:   Procedure Laterality Date   ??? CHOLECYSTECTOMY     ??? HYSTERECTOMY     ??? THYROID SURGERY Left 2000   ??? TOTAL KNEE ARTHROPLASTY Left 02/06/2018    LEFT KNEE TOTAL ARTHROPLASTY (VISIONAIRE) performed by Girtha HakeWilliam B Woods, [facility]       Current Outpatient Medications:   ???  allopurinol (ZYLOPRIM) 300 MG tablet, TAKE 1 TABLET BY MOUTH EVERY DAY  at night, Disp: , Rfl: 3  ???  omeprazole (PRILOSEC) 20 MG delayed release capsule, Take 20 mg by mouth nightly , Disp: , Rfl:   ???  metoprolol (TOPROL-XL) 100 MG XL tablet, Take 50 mg by mouth nightly , Disp: , Rfl:   ???  citalopram (CELEXA) 40 MG tablet, Take 40 mg by mouth nightly , Disp: , Rfl:   ???  thyroid (ARMOUR) 15 MG tablet, Take 15 mg by mouth nightly , Disp: , Rfl:   ???  amLODIPine (NORVASC) 10 MG tablet, Take 10 mg by mouth nightly , Disp: , Rfl:   ???  aspirin 325 MG EC tablet, Take 1 tablet by mouth 2 times daily for 28 days, Disp: 56 tablet, Rfl: 0  ???  celecoxib (CELEBREX) 100 MG capsule, Take 1 capsule by mouth 2 times daily for 14 days, Disp: 28 capsule, Rfl: 0  ???  gabapentin (NEURONTIN) 100 MG capsule, Take 1 capsule by mouth 3 times daily for 14 days.., Disp: 42 capsule, Rfl: 0  Allergies   Allergen Reactions   ??? Bee Venom Anaphylaxis     Social History     Socioeconomic History   ??? Marital status: Single     Spouse name: Not on file   ??? Number of children: Not on file   ??? Years of education: Not on file   ??? Highest education level: Not on file   Occupational History   ??? Not on file   Social Needs   ??? Financial resource strain: Not on file   ??? Food  insecurity:     Worry: Not on file     Inability: Not on file   ??? Transportation needs:     Medical: Not on file     Non-medical: Not on file   Tobacco Use   ??? Smoking status: Current Every Day Smoker     Packs/day: 0.50     Types: Cigarettes   ??? Smokeless tobacco: Never Used   Substance and Sexual Activity   ??? Alcohol use: No   ??? Drug use: No   ??? Sexual activity: Never   Lifestyle   ??? Physical activity:     Days per week: Not on file     Minutes per session: Not on file   ??? Stress: Not on file   Relationships   ??? Social connections:     Talks on phone: Not on file     Gets together: Not  on file     Attends religious service: Not on file     Active member of club or organization: Not on file     Attends meetings of clubs or organizations: Not on file     Relationship status: Not on file   ??? Intimate partner violence:     Fear of current or ex partner: Not on file     Emotionally abused: Not on file     Physically abused: Not on file     Forced sexual activity: Not on file   Other Topics Concern   ??? Not on file   Social History Narrative   ??? Not on file       Review of Systems:     Skin: (-) rash,(-) psoriasis,(-) eczema, (-)skin cancer.   Musculoskeletal: (-) fractures,  (-) dislocations,(-) collagen vascular disease, (-) fibromyalgia, (-) multiple sclerosis, (-) muscular dystrophy, (-) RSD,(-) joint pain (-)swelling, (-) joint pain,swelling.  Neurologic: (-) epilepsy, (-)seizures,(-) brain tumor,(-) TIA, (-)stroke, (-)headaches, (-)Parkinson disease,(-) memory loss, (-) LOC.  Cardiovascular: (-) Chest pain, (-) swelling in legs/feet, (-) SOB, (-) cramping in legs/feet with walking.    Subjective:    Constitutional:    The patient is alert and oriented x 3, appears to be stated age and in no distress.   Temp 98 ??F (36.7 ??C)    Ht 5\' 6"  (1.676 m)    Wt 205 lb (93 kg)    LMP 05/25/2015    BMI 33.09 kg/m??     Skin:  Upon inspection: the skin appears warm, dry and intact.  There is a previous scar over the affected  area.There is not any cellulitis, lymphedema or cutaneous lesions noted in the lower extremities.   Upon palpation there is no induration noted.      Neurologic:  Gait: normal;  Motor exam of the lower extremities show ; quadriceps, hamstrings, foot dorsi and plantar flexors intact R.  5/5 and L. 5/5. Deep tendon reflexes are 2/4 at the knees and 2/4 at the ankles with strong extensor hallicus longus motor strength bilaterally. Sensory to both feet is intact to all sensory roots.    Cardiovascular:  The vascular exam is normal and is well perfused to distal extremities.  Distal pulses DP/PT: R. 2+; L. 2+.  There is cap refill noted less than two seconds in all digits. There is not edema of the bilateral lower extremities.  There is not varicosities noted in the distal extremities.      Lymph:  Upon palpation,  there is no lymphadenopathy noted in bilateral lower extremities.      Musculoskeletal:    Lumbar exam:  On visual inspection, there is not deformity of the spine.   full range of motion, no tenderness, palpable spasm or pain on motion. Special tests: Straight Leg Raise negative, Faber testnegative.    Hip exam:  Upon inspection, there is not deformity noted.  Upon palpation there is not tenderness.  ROM: is   full and symmetrical.   Strength: Hip Flexors 5/5; Hip Abductors 5/5; Hip Adduction 5/5.     Knee exam:  Left knee exam shows;  range of motion of R. Knee is 0 to 120, and L. Knee is 0 to 120. The patient does not have  pain on motion, effusion is none, there is not tenderness over the  medial, lateral region, there are not any masses, there is not ligamentous instability, there is not  deformity noted.  Knee exam: neither  positive for moderate crepitations, some mild tenderness laxity is not noted with  stress.      R. Knee:  Lachman's negative, Anterior Drawer negative, Posterior Drawer negative  McMurray's negative, Thallasy  negative,   PF grind test negative, Apprehension test negative, Patellar J  sign  negative  L. Knee:  Lachman's negative, Anterior Drawer negative, Posterior Drawer negative  McMurray's negative, Thallasy  negative,   PF grind test negative, Apprehension test negative,  Patellar J sign  Negative    Xrays: right TKA well aligned with no signs of aseptic loosening    Radiographic findings reviewed with patient    Impression:   Encounter Diagnosis   Name Primary?   ??? S/P total knee arthroplasty, left Yes       Plan:  Continue HEP  Activity as tolerated  FU in 6 months with xr  Ok to return to 8 hr work days on September 29

## 2018-09-12 ENCOUNTER — Ambulatory Visit (HOSPITAL_COMMUNITY)
Admission: EM | Admit: 2018-09-12 | Discharge: 2018-09-12 | Disposition: A | Discharge status: Home / Self Care | Payer: Managed Care, Other (non HMO) | Attending: Family Medicine | Admitting: Family Medicine

## 2018-09-12 ENCOUNTER — Other Ambulatory Visit: Payer: Self-pay

## 2018-09-12 ENCOUNTER — Encounter (HOSPITAL_COMMUNITY): Payer: Self-pay | Admitting: Emergency Medicine

## 2018-09-12 DIAGNOSIS — F17210 Nicotine dependence, cigarettes, uncomplicated: Secondary | ICD-10-CM | POA: Diagnosis not present

## 2018-09-12 DIAGNOSIS — J209 Acute bronchitis, unspecified: Secondary | ICD-10-CM | POA: Diagnosis not present

## 2018-09-12 DIAGNOSIS — R39198 Other difficulties with micturition: Secondary | ICD-10-CM

## 2018-09-12 LAB — POCT URINALYSIS DIP (DEVICE)
Bilirubin Urine: NEGATIVE
Glucose, UA: NEGATIVE mg/dL
Hgb urine dipstick: NEGATIVE
Ketones, ur: NEGATIVE mg/dL
Leukocytes, UA: NEGATIVE
NITRITE: NEGATIVE
PROTEIN: NEGATIVE mg/dL
Specific Gravity, Urine: 1.01 (ref 1.005–1.030)
UROBILINOGEN UA: 0.2 mg/dL (ref 0.0–1.0)
pH: 6.5 (ref 5.0–8.0)

## 2018-09-12 MED ORDER — AZITHROMYCIN 250 MG PO TABS: ORAL_TABLET | ORAL | 0 refills | Status: AC

## 2018-09-12 NOTE — ED Provider Notes (Signed)
Paula Brown    CSN: [id number] Arrival date & time: 09/12/18  1950     History   Chief Complaint Chief Complaint  Patient presents with  . URI  . Urinary Tract Infection    HPI Paula Brown is a 52 y.o. female.   Paula Brown presents with complaints of cough, fatigue, congestion without drainage and fevers. Feels like her symptoms are worsening. Cough is at times productive. No sore throat or ear pain. No gi/gu complaints. Has had ill family but they have improved. States also with sensation of needing to urinate but only small amount of urine output. Spasm like sensation. No back pain, abdominal pain, blood in urine, or pain with urination. States has had similar with UTI in the past when she was in the hospital. Took ibuprofen two hours ago which helped with fever, no feels sweating. Has been taking nyquil and theraflu as well. No asthma history. Does smoke.    ROS per HPI.      Past Medical History:  Diagnosis Date  . Hypertension     There are no active problems to display for this patient.   Past Surgical History:  Procedure Laterality Date  . ABDOMINAL HYSTERECTOMY    . CHOLECYSTECTOMY    . KNEE SURGERY    . REPLACEMENT TOTAL KNEE    . THYROIDECTOMY      OB History   None      Home Medications    Prior to Admission medications   Medication Sig Start Date End Date Taking? Authorizing Provider  gabapentin (NEURONTIN) 300 MG capsule Take 300 mg by mouth 3 (three) times daily.   Yes [provider]  amLODipine (NORVASC) 10 MG tablet Take 10 mg by mouth daily.    [provider]  azithromycin (ZITHROMAX) 250 MG tablet Take 2 tablets (500 mg total) by mouth daily for 1 day, THEN 1 tablet (250 mg total) daily for 4 days. 09/12/18 09/17/18  Zigmund Gottron, NP  citalopram (CELEXA) 40 MG tablet Take 40 mg by mouth daily.    [provider]  diphenhydrAMINE (BENADRYL) 25 MG tablet Take 1 tablet (25 mg total) by mouth every 6  (six) hours. 05/27/17   Waynetta Pean, PA-C  EPINEPHrine (EPIPEN 2-PAK) 0.3 mg/0.3 mL IJ SOAJ injection Inject 0.3 mLs (0.3 mg total) into the muscle once as needed (for severe allergic reaction). CAll 911 immediately if you have to use this medicine 05/27/17   Waynetta Pean, PA-C  hydrochlorothiazide (MICROZIDE) 12.5 MG capsule Take 12.5 mg by mouth daily.    [provider]  HYDROcodone-acetaminophen (NORCO) 10-325 MG tablet Take 1 tablet by mouth every 6 (six) hours as needed for moderate pain.    [provider]  metoprolol succinate (TOPROL-XL) 100 MG 24 hr tablet Take 50 mg by mouth daily. Take with or immediately following a meal.     [provider]  omeprazole (PRILOSEC) 20 MG capsule Take 20 mg by mouth daily.    [provider]  thyroid (ARMOUR) 15 MG tablet Take 15 mg by mouth daily.    [provider]    Family History Family History  Problem Relation Age of Onset  . Diabetes Mother   . Hypertension Mother   . Healthy Father     Social History Social History   Tobacco Use  . Smoking status: Current Every Day Smoker  . Smokeless tobacco: Never Used  Substance Use Topics  . Alcohol use: Yes  . Drug use:  Never     Allergies   Bee venom   Review of Systems Review of Systems   Physical Exam Triage Vital Signs ED Triage Vitals  Enc Vitals Group     BP 09/12/18 2014 132/68     Pulse Rate 09/12/18 2013 70     Resp 09/12/18 2013 18     Temp 09/12/18 2013 98.5 F (36.9 C)     Temp Source 09/12/18 2013 Oral     SpO2 09/12/18 2013 99 %     Weight --      Height --      Head Circumference --      Peak Flow --      Pain Score 09/12/18 2008 4     Pain Loc --      Pain Edu? --      Excl. in [place]? --    No data found.  Updated Vital Signs BP 132/68 (BP Location: Right Arm)   Pulse 70   Temp 98.5 F (36.9 C) (Oral)   Resp 18   SpO2 99%   Physical Exam  Constitutional: She is oriented to person, place, and  time. She appears well-developed and well-nourished. No distress.  HENT:  Head: Normocephalic and atraumatic.  Right Ear: Tympanic membrane, external ear and ear canal normal.  Left Ear: Tympanic membrane, external ear and ear canal normal.  Nose: Nose normal.  Mouth/Throat: Uvula is midline, oropharynx is clear and moist and mucous membranes are normal. No tonsillar exudate.  Eyes: Pupils are equal, round, and reactive to light. Conjunctivae and EOM are normal.  Cardiovascular: Normal rate, regular rhythm and normal heart sounds.  Pulmonary/Chest: Effort normal. She has decreased breath sounds.  Abdominal: Soft. Bowel sounds are normal. There is no tenderness. There is no rigidity, no rebound, no guarding and no CVA tenderness.  Neurological: She is alert and oriented to person, place, and time.  Skin: Skin is warm. She is diaphoretic.     UC Treatments / Results  Labs (all labs ordered are listed, but only abnormal results are displayed) Labs Reviewed  POCT URINALYSIS DIP (DEVICE)    EKG None  Radiology No results found.  Procedures Procedures (including critical care time)  Medications Ordered in UC Medications - No data to display  Initial Impression / Assessment and Plan / UC Course  I have reviewed the triage vital signs and the nursing notes.  Pertinent labs & imaging results that were available during my care of the patient were reviewed by me and considered in my medical decision making (see chart for details).     Patient diaphoretic with obvious fever break in clinic tonight, dry cough, smoking history, mildly diminished lung sounds, worsening of URI symptoms. Opted to provide coverage with azithromycin at this time. UA negative. Push fluids. If symptoms worsen or do not improve in the next week to return to be seen or to follow up with PCP.  Return precautions provided. Patient verbalized understanding and agreeable to plan.   Final Clinical Impressions(s) / UC  Diagnoses   Final diagnoses:  Acute bronchitis, unspecified organism     Discharge Instructions     Your urine is normal today, no indication of infection.  Drink plenty of water to empty bladder regularly. Avoid alcohol and caffeine as these may irritate the bladder.   Complete course of antibiotics for bronchitis.  Tylenol and/or ibuprofen as needed for pain or fevers.   May continue with over the counter treatments as needed for  symptoms.  If symptoms worsen or do not improve in the next week to return to be seen or to follow up with your PCP.     ED Prescriptions    Medication Sig Dispense Auth. Provider   azithromycin (ZITHROMAX) 250 MG tablet Take 2 tablets (500 mg total) by mouth daily for 1 day, THEN 1 tablet (250 mg total) daily for 4 days. 6 tablet Zigmund Gottron, NP     Controlled Substance Prescriptions [place] Controlled Substance Registry consulted? Not Applicable   Zigmund Gottron, NP 09/12/18 2039

## 2018-09-12 NOTE — Discharge Instructions (Signed)
Your urine is normal today, no indication of infection.  Drink plenty of water to empty bladder regularly. Avoid alcohol and caffeine as these may irritate the bladder.   Complete course of antibiotics for bronchitis.  Tylenol and/or ibuprofen as needed for pain or fevers.   May continue with over the counter treatments as needed for symptoms.  If symptoms worsen or do not improve in the next week to return to be seen or to follow up with your PCP.

## 2018-09-12 NOTE — ED Triage Notes (Signed)
Onset 4 days ago of not feeling well.  Then started having a cough and chest congestion.  Family member took temperature and was told it was 102.    Took ibuprofen.    Patient has burning with urination and bladder spasms. These symptoms noticed today.

## 2019-02-12 ENCOUNTER — Encounter

## 2019-02-13 ENCOUNTER — Encounter: Primary: Emergency Medicine

## 2019-02-13 ENCOUNTER — Encounter: Payer: PRIVATE HEALTH INSURANCE | Attending: Orthopaedic Surgery | Primary: Emergency Medicine

## 2019-03-06 ENCOUNTER — Encounter

## 2019-03-07 ENCOUNTER — Ambulatory Visit
Admit: 2019-03-07 | Discharge: 2019-03-07 | Payer: PRIVATE HEALTH INSURANCE | Attending: Adult Health | Primary: Emergency Medicine

## 2019-03-07 ENCOUNTER — Ambulatory Visit: Admit: 2019-03-07 | Discharge: 2019-03-07 | Payer: PRIVATE HEALTH INSURANCE | Primary: Emergency Medicine

## 2019-03-07 DIAGNOSIS — Z96652 Presence of left artificial knee joint: Secondary | ICD-10-CM

## 2019-03-07 MED ORDER — DICLOFENAC SODIUM 1 % TD GEL
1 % | TRANSDERMAL | 5 refills | Status: AC
Start: 2019-03-07 — End: ?

## 2019-03-07 NOTE — Progress Notes (Signed)
Chief Complaint   Patient presents with   ??? Knee Pain     Left TKA, DOS 02/06/2018       Emma Pena returns today for follow-up of her left TKA. DOS: 02/06/18. She reports this is better than when I saw the patient last.  She has pain after workin glong days in which she is on her feet.    Past Medical History:   Diagnosis Date   ??? Anxiety    ??? Arthritis    ??? GERD (gastroesophageal reflux disease)    ??? Hypertension    ??? Thyroid disease      Past Surgical History:   Procedure Laterality Date   ??? CHOLECYSTECTOMY     ??? HYSTERECTOMY     ??? THYROID SURGERY Left 2000   ??? TOTAL KNEE ARTHROPLASTY Left 02/06/2018    LEFT KNEE TOTAL ARTHROPLASTY (VISIONAIRE) performed by Girtha Hake, DO at [facility] OR       Current Outpatient Medications:   ???  diclofenac sodium (VOLTAREN) 1 % GEL, Apply 1 pump twice daily as directed Formula #1, OK to substitute, Disp: 240 g, Rfl: 5  ???  allopurinol (ZYLOPRIM) 300 MG tablet, TAKE 1 TABLET BY MOUTH EVERY DAY  at night, Disp: , Rfl: 3  ???  omeprazole (PRILOSEC) 20 MG delayed release capsule, Take 20 mg by mouth nightly , Disp: , Rfl:   ???  metoprolol (TOPROL-XL) 100 MG XL tablet, Take 50 mg by mouth nightly , Disp: , Rfl:   ???  citalopram (CELEXA) 40 MG tablet, Take 40 mg by mouth nightly , Disp: , Rfl:   ???  thyroid (ARMOUR) 15 MG tablet, Take 15 mg by mouth nightly , Disp: , Rfl:   ???  amLODIPine (NORVASC) 10 MG tablet, Take 10 mg by mouth nightly , Disp: , Rfl:   ???  celecoxib (CELEBREX) 100 MG capsule, Take 1 capsule by mouth 2 times daily for 14 days, Disp: 28 capsule, Rfl: 0  ???  gabapentin (NEURONTIN) 100 MG capsule, Take 1 capsule by mouth 3 times daily for 14 days.., Disp: 42 capsule, Rfl: 0  Allergies   Allergen Reactions   ??? Bee Venom Anaphylaxis     Social History     Socioeconomic History   ??? Marital status: Single     Spouse name: Not on file   ??? Number of children: Not on file   ??? Years of education: Not on file   ??? Highest education level: Not on file   Occupational History   ??? Not on  file   Social Needs   ??? Financial resource strain: Not on file   ??? Food insecurity     Worry: Not on file     Inability: Not on file   ??? Transportation needs     Medical: Not on file     Non-medical: Not on file   Tobacco Use   ??? Smoking status: Current Every Day Smoker     Packs/day: 0.50     Types: Cigarettes   ??? Smokeless tobacco: Never Used   Substance and Sexual Activity   ??? Alcohol use: No   ??? Drug use: No   ??? Sexual activity: Never   Lifestyle   ??? Physical activity     Days per week: Not on file     Minutes per session: Not on file   ??? Stress: Not on file   Relationships   ??? Social Wellsite geologist  on phone: Not on file     Gets together: Not on file     Attends religious service: Not on file     Active member of club or organization: Not on file     Attends meetings of clubs or organizations: Not on file     Relationship status: Not on file   ??? Intimate partner violence     Fear of current or ex partner: Not on file     Emotionally abused: Not on file     Physically abused: Not on file     Forced sexual activity: Not on file   Other Topics Concern   ??? Not on file   Social History Narrative   ??? Not on file       Review of Systems:     Skin: (-) rash,(-) psoriasis,(-) eczema, (-)skin cancer.   Musculoskeletal: (-) fractures,  (-) dislocations,(-) collagen vascular disease, (-) fibromyalgia, (-) multiple sclerosis, (-) muscular dystrophy, (-) RSD,(-) joint pain (-)swelling, (-) joint pain,swelling.  Neurologic: (-) epilepsy, (-)seizures,(-) brain tumor,(-) TIA, (-)stroke, (-)headaches, (-)Parkinson disease,(-) memory loss, (-) LOC.  Cardiovascular: (-) Chest pain, (-) swelling in legs/feet, (-) SOB, (-) cramping in legs/feet with walking.    Subjective:    Constitutional:    The patient is alert and oriented x 3, appears to be stated age and in no distress.   Temp 98 ??F (36.7 ??C)    Ht 5\' 5"  (1.651 m)    Wt 210 lb (95.3 kg)    LMP 05/25/2015    BMI 34.95 kg/m??     Skin:  Upon inspection: the skin appears  warm, dry and intact.  There is a previous scar over the affected area.There is not any cellulitis, lymphedema or cutaneous lesions noted in the lower extremities.   Upon palpation there is no induration noted.      Neurologic:  Gait: normal;  Motor exam of the lower extremities show ; quadriceps, hamstrings, foot dorsi and plantar flexors intact R.  5/5 and L. 5/5. Deep tendon reflexes are 2/4 at the knees and 2/4 at the ankles with strong extensor hallicus longus motor strength bilaterally. Sensory to both feet is intact to all sensory roots.    Cardiovascular:  The vascular exam is normal and is well perfused to distal extremities.  Distal pulses DP/PT: R. 2+; L. 2+.  There is cap refill noted less than two seconds in all digits. There is not edema of the bilateral lower extremities.  There is not varicosities noted in the distal extremities.      Lymph:  Upon palpation,  there is no lymphadenopathy noted in bilateral lower extremities.      Musculoskeletal:    Lumbar exam:  On visual inspection, there is not deformity of the spine.   full range of motion, no tenderness, palpable spasm or pain on motion. Special tests: Straight Leg Raise negative, Faber testnegative.    Hip exam:  Upon inspection, there is not deformity noted.  Upon palpation there is not tenderness.  ROM: is   full and symmetrical.   Strength: Hip Flexors 5/5; Hip Abductors 5/5; Hip Adduction 5/5.     Knee exam:  Left knee exam shows;  range of motion of R. Knee is 0 to 120, and L. Knee is 0 to 120. The patient does not have  pain on motion, effusion is none, there is not tenderness over the  medial, lateral region, there are not any masses, there is not  ligamentous instability, there is not  deformity noted.  Knee exam: neither positive for moderate crepitations, some mild tenderness laxity is not noted with  stress.      R. Knee:  Lachman's negative, Anterior Drawer negative, Posterior Drawer negative  McMurray's negative, Thallasy  negative,    PF grind test negative, Apprehension test negative, Patellar J sign  negative  L. Knee:  Lachman's negative, Anterior Drawer negative, Posterior Drawer negative  McMurray's negative, Thallasy  negative,   PF grind test negative, Apprehension test negative,  Patellar J sign  Negative    Xrays: left TKA well aligned with no signs of aseptic loosening    Radiographic findings reviewed with patient    Impression:   Encounter Diagnosis   Name Primary?   ??? S/P total knee arthroplasty, left Yes       Plan:  [facility] pain cream  Continue HEP  Activity as tolerated  FU in 2 years with xr

## 2019-04-21 ENCOUNTER — Other Ambulatory Visit: Payer: Self-pay

## 2019-04-21 ENCOUNTER — Emergency Department (HOSPITAL_COMMUNITY)
Admission: EM | Admit: 2019-04-21 | Discharge: 2019-04-21 | Disposition: A | Discharge status: Home / Self Care | Payer: Managed Care, Other (non HMO) | Attending: Emergency Medicine | Admitting: Emergency Medicine

## 2019-04-21 ENCOUNTER — Encounter (HOSPITAL_COMMUNITY): Payer: Self-pay | Admitting: Emergency Medicine

## 2019-04-21 ENCOUNTER — Emergency Department (HOSPITAL_COMMUNITY): Payer: Managed Care, Other (non HMO)

## 2019-04-21 DIAGNOSIS — Y999 Unspecified external cause status: Secondary | ICD-10-CM | POA: Diagnosis not present

## 2019-04-21 DIAGNOSIS — F17210 Nicotine dependence, cigarettes, uncomplicated: Secondary | ICD-10-CM | POA: Insufficient documentation

## 2019-04-21 DIAGNOSIS — Y9389 Activity, other specified: Secondary | ICD-10-CM | POA: Diagnosis not present

## 2019-04-21 DIAGNOSIS — I10 Essential (primary) hypertension: Secondary | ICD-10-CM | POA: Insufficient documentation

## 2019-04-21 DIAGNOSIS — S161XXA Strain of muscle, fascia and tendon at neck level, initial encounter: Secondary | ICD-10-CM | POA: Insufficient documentation

## 2019-04-21 DIAGNOSIS — Z79899 Other long term (current) drug therapy: Secondary | ICD-10-CM | POA: Diagnosis not present

## 2019-04-21 DIAGNOSIS — S199XXA Unspecified injury of neck, initial encounter: Secondary | ICD-10-CM | POA: Diagnosis present

## 2019-04-21 DIAGNOSIS — Y92410 Unspecified street and highway as the place of occurrence of the external cause: Secondary | ICD-10-CM | POA: Insufficient documentation

## 2019-04-21 MED ORDER — CYCLOBENZAPRINE HCL 10 MG PO TABS: 10.0000 mg | ORAL_TABLET | Freq: Two times a day (BID) | ORAL | 0 refills | Status: DC | PRN

## 2019-04-21 MED ORDER — OXYCODONE-ACETAMINOPHEN 5-325 MG PO TABS
1.0000 | ORAL_TABLET | Freq: Once | ORAL | Status: AC
  Administered 2019-04-21: 19:00:00 1 via ORAL
  Filled 2019-04-21: qty 1

## 2019-04-21 NOTE — ED Notes (Signed)
Patient verbalizes understanding of discharge instructions. Opportunity for questioning and answers were provided. Armband removed by staff, pt discharged from ED ambulatory to home.  

## 2019-04-21 NOTE — Discharge Instructions (Signed)
Please read instructions below. Apply ice to your areas of pain for 20 minutes at a time. You can take 600 mg of Advil/ibuprofen with food as needed for pain. Additionally, you can take tylenol. You can take flexeril every 12 hours as needed for muscle spasm. Be aware this medication can make you drowsy. Schedule an appointment with your primary care provider to follow up on your visit today. Return to the ER for severely worsening headache, vision changes, if new numbness or tingling in your arms or legs, inability to urinate, inability to hold your bowels, or weakness in your extremities.
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
## 2019-04-21 NOTE — ED Notes (Signed)
Patient transported to CT 
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
## 2019-05-03 ENCOUNTER — Inpatient Hospital Stay
Admit: 2019-05-03 | Discharge: 2019-05-03 | Disposition: A | Discharge status: Home / Self Care | Payer: PRIVATE HEALTH INSURANCE | Attending: Emergency Medicine

## 2019-05-03 ENCOUNTER — Emergency Department: Admit: 2019-05-03 | Payer: PRIVATE HEALTH INSURANCE | Primary: Emergency Medicine

## 2019-05-03 DIAGNOSIS — S161XXA Strain of muscle, fascia and tendon at neck level, initial encounter: Secondary | ICD-10-CM

## 2019-05-03 MED ORDER — ACETAMINOPHEN 500 MG PO TABS
500 MG | Freq: Once | ORAL | Status: AC
Start: 2019-05-03 — End: 2019-05-03
  Administered 2019-05-03: 20:00:00 1000 mg via ORAL

## 2019-05-03 MED FILL — TYLENOL EXTRA STRENGTH 500 MG PO TABS: 500 mg | ORAL | Qty: 2
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
## 2019-05-03 NOTE — ED Notes (Addendum)
C-collar removed per provider. Pt tolerated well. Pt ambulatory to restroom.      Jola Babinski, RN  05/03/19 1647       Jola Babinski, RN  05/03/19 [phone]

## 2019-09-22 ENCOUNTER — Encounter: Payer: Self-pay | Admitting: Neurology

## 2019-09-22 ENCOUNTER — Ambulatory Visit (INDEPENDENT_AMBULATORY_CARE_PROVIDER_SITE_OTHER): Payer: Managed Care, Other (non HMO) | Admitting: Neurology

## 2019-09-22 ENCOUNTER — Other Ambulatory Visit: Payer: Self-pay

## 2019-09-22 VITALS — BP 128/82 | HR 66 | Temp 97.8°F | Ht 65.0 in | Wt 224.0 lb

## 2019-09-22 DIAGNOSIS — R5383 Other fatigue: Secondary | ICD-10-CM

## 2019-09-22 DIAGNOSIS — K5903 Drug induced constipation: Secondary | ICD-10-CM | POA: Diagnosis not present

## 2019-09-22 DIAGNOSIS — R0683 Snoring: Secondary | ICD-10-CM

## 2019-09-22 DIAGNOSIS — S134XXD Sprain of ligaments of cervical spine, subsequent encounter: Secondary | ICD-10-CM

## 2019-09-22 DIAGNOSIS — T402X5A Adverse effect of other opioids, initial encounter: Secondary | ICD-10-CM

## 2019-09-22 DIAGNOSIS — D34 Benign neoplasm of thyroid gland: Secondary | ICD-10-CM | POA: Diagnosis not present

## 2019-09-22 NOTE — Patient Instructions (Signed)

## 2019-09-22 NOTE — Progress Notes (Signed)
SLEEP MEDICINE CLINIC    Provider:  Larey Seat, MD  Primary Care Physician:  Haywood Pao, MD [place] [address]     Referring Provider: Haywood Pao, [place] [address] [place] [place],  [place] [address]          Chief Complaint according to patient   Patient presents with:    . New Patient (Initial Visit)           HISTORY OF PRESENT ILLNESS:  Paula Brown is a 53 y.o. year old  Caucasian female patient seen on 09/22/2019 from Dr. Randel Pigg for a sleep consultation.   Chief concern according to patient : " I am snoring, I am talking in my sleep, have been recorded, and I gasp ( rarely)". See RN quoting : the patient states that she snores in sleep, she wakes up sometimes gasping for air, her friend recording her having apnea events. never had sleep study results I have the pleasure of seeing Paula Brown today, a right handed  Caucasian female with a possible sleep disorder.  She has a  medical history of Hypertension, excessive daytime sleepiness, fatigue, overweight- BMI close to 38. She had a partial thyroidectomy. Goiter.    Sleep relevant medical history: Nocturia; 2-3 AM, 5 AM 2 times., Sleep Talking very frequently. Gasping , No TBI but a concussion in May with cervical spine trauma. No deviated septum.    Family medical /sleep history:  other family member on CPAP with OSA, insomnia, sleep walkers.    Social history:  Patient is working as Surveyor, minerals / Scientist, water quality and lives in a household with husband.  Family status is married , with 2 Adult children, 42 and 47 years old.  The patient currently works days. Last worked late and night shift a year ago.  Pets are 1 dog.Tobacco use: yes- 10 cigs a day.  ETOH use; on weekends, 1 drink a day or less. ,  Caffeine intake in form of Coffee( 2 cups in AM ) Soda( rare ) Tea ( less than weekly) or energy drinks. Regular exercise in form of walking.   Hobbies : flee markets, crafts.   Sleep  habits are as follows:  The patient's dinner time is between 5-6  PM. The patient goes to bed at 9 PM and continues to sleep for several hours  wakes for 2 bathroom breaks, the first time at 2-3 AM.   The preferred sleep position is side ways-, with the support of 1 pillow.  Watches  TV in bedroom but never sleeps with it on.   Dreams are reportedly frequent.  5 AM is the usual rise time. The patient wakes up spontaneously without an alarm.  She reports feeling refreshed and  restored in AM, without symptoms such as dry mouth , but  morning headaches- attributed to tooth aches.  Naps are taken frequently, lasting from 1 to 1.5  minutes.    Review of Systems: Out of a complete 14 system review, the patient complains of only the following symptoms, and all other reviewed systems are negative.:  Fatigue, sleepiness ,snoring.   How likely are you to doze in the following situations: 0 = not likely, 1 = slight chance, 2 = moderate chance, 3 = high chance   Sitting and Reading? Watching Television? Sitting inactive in a public place (theater or meeting)? As a passenger in a car for an hour without a break? Lying down in the afternoon when circumstances permit?  Sitting and talking to someone? Sitting quietly after lunch without alcohol? In a car, while stopped for a few minutes in traffic?   Total = 11/ 24 points   FSS endorsed at 50/ 63 points.  I am tired!   Social History   Socioeconomic History  . Marital status: Soil scientist    Spouse name: Not on file  . Number of children: Not on file  . Years of education: Not on file  . Highest education level: Not on file  Occupational History  . Not on file  Social Needs  . Financial resource strain: Not on file  . Food insecurity    Worry: Not on file    Inability: Not on file  . Transportation needs    Medical: Not on file    Non-medical: Not on file  Tobacco Use  . Smoking status: Current Every Day Smoker    Packs/day: 0.50     Years: 25.00    Pack years: 12.50    Types: Cigarettes  . Smokeless tobacco: Never Used  Substance and Sexual Activity  . Alcohol use: Yes    Comment: occasional  . Drug use: Never  . Sexual activity: Not on file  Lifestyle  . Physical activity    Days per week: Not on file    Minutes per session: Not on file  . Stress: Not on file  Relationships  . Social Herbalist on phone: Not on file    Gets together: Not on file    Attends religious service: Not on file    Active member of club or organization: Not on file    Attends meetings of clubs or organizations: Not on file    Relationship status: Not on file  Other Topics Concern  . Not on file  Social History Narrative  . Not on file    Family History  Problem Relation Age of Onset  . Diabetes Mother   . Hypertension Mother   . Healthy Father     Past Medical History:  Diagnosis Date  . Hypertension     Past Surgical History:  Procedure Laterality Date  . ABDOMINAL HYSTERECTOMY    . CHOLECYSTECTOMY    . KNEE SURGERY    . REPLACEMENT TOTAL KNEE    . THYROIDECTOMY       Current Outpatient Medications on File Prior to Visit  Medication Sig Dispense Refill  . amLODipine (NORVASC) 10 MG tablet Take 10 mg by mouth daily.    . citalopram (CELEXA) 40 MG tablet Take 40 mg by mouth daily.    Marland Kitchen gabapentin (NEURONTIN) 600 MG tablet Take 600 mg by mouth 2 (two) times daily.     . hydrochlorothiazide (MICROZIDE) 12.5 MG capsule Take 12.5 mg by mouth daily.    . metoprolol succinate (TOPROL-XL) 100 MG 24 hr tablet Take 50 mg by mouth daily. Take with or immediately following a meal.     . oxyCODONE (OXY IR/ROXICODONE) 5 MG immediate release tablet Take 5 mg by mouth every 12 (twelve) hours.    Marland Kitchen EPINEPHrine (EPIPEN 2-PAK) 0.3 mg/0.3 mL IJ SOAJ injection Inject 0.3 mLs (0.3 mg total) into the muscle once as needed (for severe allergic reaction). CAll 911 immediately if you have to use this medicine (Patient not  taking: Reported on 09/22/2019) 1 Device 1   No current facility-administered medications on file prior to visit.     Allergies  Allergen Reactions  . Bee Venom Anaphylaxis  Physical exam:  Today's Vitals   09/22/19 0958  BP: 128/82  Pulse: 66  Temp: 97.8 F (36.6 C)  Weight: 224 lb (101.6 kg)  Height: 5\' 5"  (1.651 m)   Body mass index is 37.28 kg/m.   Wt Readings from Last 3 Encounters:  09/22/19 224 lb (101.6 kg)  04/21/19 210 lb (95.3 kg)     Ht Readings from Last 3 Encounters:  09/22/19 5\' 5"  (1.651 m)  04/21/19 5\' 6"  (1.676 m)      General: The patient is awake, alert and appears not in acute distress. The patient is well groomed. Head: Normocephalic, atraumatic. Neck is supple. Mallampati - 3. Rises above the tongue. ,  neck circumference:16. 25 "  inches . Nasal airflow patent.  Retrognathia is not seen.  Dental status:  Cardiovascular:  Regular rate and cardiac rhythm by pulse,  without distended neck veins. Respiratory: Lungs are clear to auscultation.  Skin:  Without evidence of ankle edema, or rash. Trunk: The patient's posture is erect.   Neurologic exam : The patient is awake and alert, oriented to place and time.   Memory subjective described as intact.  Attention span & concentration ability appears normal.  Speech is fluent,  without  dysarthria, dysphonia or aphasia.  Mood and affect are appropriate.    Cranial nerves: no loss of smell or taste reported  Pupils are equal and briskly reactive to light. Funduscopic exam deferred.   Extraocular movements in vertical and horizontal planes were intact and without nystagmus. No Diplopia. Visual fields by finger perimetry are intact. Hearing was intact to soft voice and finger rubbing. Facial sensation intact to fine touch.  Facial motor strength is symmetric.  Tongue and uvula move midline.  Neck ROM : rotation, tilt and flexion extension were normal for age and shoulder shrug was symmetrical.  She  feels stiff.  Motor exam:  Symmetric bulk, tone and ROM.   Normal tone without cog wheeling, symmetric grip strength . Sensory:  Fine touch, pinprick and vibration were tested  and  normal.  Proprioception tested in the upper extremities was normal. Coordination: Rapid alternating movements in the fingers/hands were of normal speed. Penman ship reported "messier'.  The Finger-to-nose maneuver was intact without evidence of ataxia, dysmetria or tremor. Gait and station: Patient could rise unassisted from a seated position, walked without assistive device.  Stance is of normal width/ base and the patient turned with 3  steps.  Toe and heel walk were deferred.  Deep tendon reflexes: in the  upper and lower extremities are symmetric and intact.  Babinski response was deferred.     After spending a total time of  35  minutes face to face and additional time for physical and neurologic examination, review of laboratory studies,  personal review of imaging studies, reports and results of other testing and review of referral information / records as far as provided in visit, I have established the following assessments:  1)  sleep talking, not walking. She has acted out dreams, felt chased. knocked off her night stand lamp.  2) isolated gasping for breath  3) menopausal heat flashes,  Still feeling warm and cool, may be thyroid related , 4) thyroid function reported normal.  5) obesity    My Plan is to proceed with:  1) HST for apnea screening.  2) consider full sleep study for sleep talkiing and dream enactment.    I would like to thank Tisovec, Fransico Him, MD  [address]  [place],  [place] [address] for allowing me to meet with and to take care of this pleasant patient.   In short, Paula Brown is presenting with fatigue, sleepiness and weight gain.  , all symptoms that can be attributed to OSA. There is also isolated dream enactment.   I plan to follow up either personally or through  our NP within 2-3 month.   CC: I will share my notes with Dr. Randel Pigg.   Electronically signed by: Larey Seat, MD 09/22/2019 10:23 AM  [facility] Board certified by The [employer] of Sleep Medicine and Diplomate of the [employer] of Sleep Medicine. Board certified In Neurology through the [place], Fellow of the [employer] of Neurology. Medical Director of [facility].

## 2019-10-01 ENCOUNTER — Other Ambulatory Visit: Payer: Self-pay

## 2019-10-01 ENCOUNTER — Ambulatory Visit (INDEPENDENT_AMBULATORY_CARE_PROVIDER_SITE_OTHER): Payer: Managed Care, Other (non HMO) | Admitting: Neurology

## 2019-10-01 DIAGNOSIS — D34 Benign neoplasm of thyroid gland: Secondary | ICD-10-CM

## 2019-10-01 DIAGNOSIS — R0683 Snoring: Secondary | ICD-10-CM

## 2019-10-01 DIAGNOSIS — G4733 Obstructive sleep apnea (adult) (pediatric): Secondary | ICD-10-CM

## 2019-10-01 DIAGNOSIS — S134XXD Sprain of ligaments of cervical spine, subsequent encounter: Secondary | ICD-10-CM

## 2019-10-01 DIAGNOSIS — K5903 Drug induced constipation: Secondary | ICD-10-CM

## 2019-10-01 DIAGNOSIS — R5383 Other fatigue: Secondary | ICD-10-CM

## 2019-10-01 DIAGNOSIS — T402X5A Adverse effect of other opioids, initial encounter: Secondary | ICD-10-CM

## 2019-10-13 DIAGNOSIS — G4733 Obstructive sleep apnea (adult) (pediatric): Secondary | ICD-10-CM | POA: Insufficient documentation

## 2019-10-13 DIAGNOSIS — S134XXA Sprain of ligaments of cervical spine, initial encounter: Secondary | ICD-10-CM | POA: Insufficient documentation

## 2019-10-13 DIAGNOSIS — R0683 Snoring: Secondary | ICD-10-CM | POA: Insufficient documentation

## 2019-10-13 DIAGNOSIS — K5903 Drug induced constipation: Secondary | ICD-10-CM | POA: Insufficient documentation

## 2019-10-13 DIAGNOSIS — D34 Benign neoplasm of thyroid gland: Secondary | ICD-10-CM | POA: Insufficient documentation

## 2019-10-13 DIAGNOSIS — T402X5A Adverse effect of other opioids, initial encounter: Secondary | ICD-10-CM | POA: Insufficient documentation

## 2019-10-13 DIAGNOSIS — R5383 Other fatigue: Secondary | ICD-10-CM | POA: Insufficient documentation

## 2019-10-13 NOTE — Procedures (Signed)
Patient Information     First Name: Paula Last Name: Brown ID: [id number]  Birth Date: Nov 25, 1966 Age: 53 Gender: Female  Referring Provider: Haywood Pao, MD BMI: 37.5 (W=225 lb, H=5' 5'')  Neck Circ.:  16 '' Epworth:  11/24   Sleep Study Information    Study Date: Oct 14-15, 2020 S/H/A Version: 001.001.001.001 / 4.1.1528 / 76  History:    Paula Brown is a 53 year- old Caucasian female patient and was seen on 09/22/2019 upon referral from [name] for a sleep consultation.   Chief concern according to patient: " I am snoring, I am talking in my sleep, have been recorded, and I gasp - but rarely". See RN quoting: the patient states that she snores in sleep, she wakes up sometimes gasping for air, a friend recorded her having apnea events.  Paula Brown is a right handed Caucasian female a medical history of Hypertension, excessive daytime sleepiness, fatigue, overweight- BMI close to 38. She had a partial thyroidectomy, Goiter.      Summary & Diagnosis:     Severe, mostly obstructive Sleep Apnea at AHI of 32.6/h with REM sleep exacerbation to AHI 42.9/h.  Strongly supine dependent at AHI 42.6/h. There was at least moderate snoring recorded.   The proportion of central apnea was at 10 %  A trend to bradycardia was noted. Insignificant sleep hypoxemia.    Recommendations:      Apnea will be immediately improving if the patient can avoid supine sleep, weight loss is also strongly advised. In the interim, treatment with a CPAP is indicated as REM dependent apnea does not correct with a dental device.  Order for CPAP auto titration 6-16 cm water, 2 cm EPR, heated humidity and mask of patient's comfort and choice.  Physician Name: Larey Seat, MD    10-10-2019            Sleep Summary    Oxygen Saturation Statistics     Start Study Time: End Study Time: Total Recording Time:  10:05:25 PM   5:22:24 AM   7 h, 16 min  Total Sleep Time % REM of Sleep Time:  6  h, 23 min  29.9    Mean: 94 Minimum: 83 Maximum: 99  Mean of Desaturations Nadirs (%):   91  Oxygen Desaturation. %:   4-9 10-20 >20 Total  Events Number Total   119  10 92.2 7.8  0 0.0  129 100.0  Oxygen Saturation: <90 <=88 <85 <80 <70  Duration (minutes): Sleep % 2.7 0.7  2.0 0.3  0.5 0.1 0.0 0.0 0.0 0.0     Respiratory Indices      Total Events REM NREM All Night  pRDI:  211  pAHI:  207 ODI:  129  pAHIc:    46  % CSR: 10.2 44.0 41.9 28.1 8.0 28.7 28.7 17.0 6.9 33.2 32.6 20.3 7.2       Pulse Rate Statistics during Sleep (BPM)      Mean: 53 Minimum: 37 Maximum:  88    Indices are calculated using technically valid sleep time of 6 h, 21 min. pRDI/pAHI are calculated using 02 desaturations ? 3%  Body Position Statistics  Position Supine Prone Right Left Non-Supine  Sleep (min) 230.5 44.0 0.0 108.5 152.5  Sleep % 60.0 11.5 0.0 28.3 39.7  pRDI 43.4 23.3 N/A 15.5 17.8  pAHI 42.6 23.3 N/A 15.0 17.4  ODI 28.9 11.0 N/A 6.1 7.5     Snoring Statistics  Snoring Level (dB) >40 >50 >60 >70 >80 >Threshold (45)  Sleep (min) 245.8 68.3 4.0 0.0 0.0 153.7  Sleep % 64.0 17.8 1.0 0.0 0.0 40.0    Mean: 45 dB

## 2019-10-13 NOTE — Addendum Note (Signed)
Addended by: Larey Seat on: 10/13/2019 01:23 PM   Modules accepted: Orders

## 2019-10-14 ENCOUNTER — Telehealth: Payer: Self-pay | Admitting: Neurology

## 2019-10-14 NOTE — Telephone Encounter (Signed)
-----   Message from Larey Seat, MD sent at 10/13/2019  1:22 PM EDT ----- Summary & Diagnosis:    Severe, mostly obstructive Sleep Apnea at AHI of 32.6/h with REM  sleep exacerbation to AHI 42.9/h.  Strongly supine dependent at AHI 42.6/h. There was at least  moderate snoring recorded.   The proportion of central apnea was at 10 % - can be related to opiate medications.  A trend to bradycardia was noted. Insignificant sleep hypoxemia.     Recommendations:     Apnea will be immediately improving if the patient can avoid  supine sleep, weight loss is also strongly advised. In the  interim, treatment with a CPAP is indicated as REM dependent  apnea does not correct with a dental device.  Order for CPAP auto titration 6-16 cm water, 2 cm EPR, heated  humidity and mask of patient's comfort and choice.  Physician Name: Larey Seat, MD   10-10-2019

## 2019-10-14 NOTE — Telephone Encounter (Signed)
I called pt. I advised pt that Dr. Brett Fairy  reviewed their sleep study results and found that pt has moderate sleep apnea. Dr. Brett Fairy recommends that pt starts auto CPAP. I reviewed PAP compliance expectations with the pt. Pt is agreeable to starting a CPAP. I advised pt that an order will be sent to a DME, [facility], and [facility] will call the pt within about one week after they file with the pt's insurance. [facility] will show the pt how to use the machine, fit for masks, and troubleshoot the CPAP if needed. A follow up appt was made for insurance purposes with Debbora Presto, NP on Dec 17,2020 at 1 pm. Pt verbalized understanding to arrive 15 minutes early and bring their CPAP. A letter with all of this information in it will be mailed to the pt as a reminder. I verified with the pt that the address we have on file is correct. Pt verbalized understanding of results. Pt had no questions at this time but was encouraged to call back if questions arise. I have sent the order to [facility] and have received confirmation that they have received the order.

## 2019-12-04 ENCOUNTER — Ambulatory Visit (INDEPENDENT_AMBULATORY_CARE_PROVIDER_SITE_OTHER): Payer: Managed Care, Other (non HMO) | Admitting: Family Medicine

## 2019-12-04 ENCOUNTER — Other Ambulatory Visit: Payer: Self-pay

## 2019-12-04 ENCOUNTER — Encounter: Payer: Self-pay | Admitting: Family Medicine

## 2019-12-04 VITALS — BP 116/75 | HR 61 | Temp 97.3°F | Ht 66.0 in | Wt 225.0 lb

## 2019-12-04 DIAGNOSIS — Z9989 Dependence on other enabling machines and devices: Secondary | ICD-10-CM | POA: Diagnosis not present

## 2019-12-04 DIAGNOSIS — G4733 Obstructive sleep apnea (adult) (pediatric): Secondary | ICD-10-CM

## 2019-12-04 NOTE — Progress Notes (Signed)
PATIENT: Paula Brown DOB: [birth year]  REASON FOR VISIT: follow up HISTORY FROM: patient  Chief Complaint  Patient presents with  . Follow-up    Initial cpap f/u. Alone. Rm 1. Patient mentioned that she can't tell a difference since using her cpap machine. She stated that her work schedule has been crazy.      HISTORY OF PRESENT ILLNESS: Today 12/04/19 Paula Brown is a 53 y.o. female here today for follow up of OSA on CPAP. She is using CPAP nightly. She is using CPAP anytime she is asleep. She feels that mask is comfortable. She does not feel any different with using therapy. She does states that she no longer snores. She is still tired. She has a crazy work schedule. She is working days and nights. Alternating schedules. She hopes that this is only short term. Otherwise, she is doing well.   Compliance report dated 10/29/2019 through 12/03/2019 reveals that she used CPAP 29 of the last 30 days for compliance of 97%.  27 days she used CPAP greater than 4 hours for compliance of 90%.  Average usage was 6 hours.  Residual AHI was 2.1 on 6 to 16 cm of water and an EPR of 2.  There was no significant leak noted.   HISTORY: (copied from Dr Dohmeier's note on 09/22/2019)  Chavela Marinois a 53 y.o. year old  Caucasian female [place] 09/22/2019 from Dr. Ihor Gully a sleep consultation.  Chiefconcernaccording to patient : " I am snoring, I am talking in my sleep, have been recorded, and I gasp ( rarely)". See RN quoting : the patient states that she snores in sleep, she wakes up sometimes gasping for air, her friend recording her having apnea events. never had sleep study results I have the pleasure of seeing Rhiannan Modzelewski today,a right handed  Caucasian female with a possible sleep disorder. She has a  medical history of Hypertension, excessive daytime sleepiness, fatigue, overweight- BMI close to 38. She had a partial thyroidectomy. Goiter.   Sleeprelevant medical  history: Nocturia; 2-3 AM, 5 AM 2 times., Sleep Talking very frequently. Gasping , No TBI but a concussion in May with cervical spine trauma. No deviated septum.   Familymedical /sleep history: other family member on CPAP with OSA, insomnia, sleep walkers.  Social history:Patient is working as Surveyor, minerals / Scientist, water quality and lives in a household with husband.  Family status is married , with 2 Adult children, 70 and 85 years old.  The patient currently works days. Last worked late and night shift a year ago.  Pets are 1 dog.Tobacco use: yes- 10 cigs a day. ETOH use; on weekends, 1 drink a day or less. ,  Caffeine intake in form of Coffee( 2 cups in AM ) Soda( rare ) Tea ( less than weekly) or energy drinks. Regular exercise in form of walking.   Hobbies : flee markets, crafts.   Sleep habits are as follows: The patient's dinner time is between 5-6  PM. The patient goes to bed at 9 PM and continues to sleep for several hours  wakes for 2 bathroom breaks, the first time at 2-3 AM.   The preferred sleep position is side ways-, with the support of 1 pillow.  Watches  TV in bedroom but never sleeps with it on.   Dreams are reportedly frequent.  5 AM is the usual rise time. The patient wakes up spontaneously without an alarm.  She reports feeling refreshed and  restored in AM, without symptoms  such as dry mouth , but  morning headaches- attributed to tooth aches.  Naps are taken frequently, lasting from 1 to 1.5  minutes.    REVIEW OF SYSTEMS: Out of a complete 14 system review of symptoms, the patient complains only of the following symptoms, apnea, joint pain, back pain, neck stiffness and all other reviewed systems are negative.  Epworth sleepiness scale: 20 Fatigue severity scale: 42   ALLERGIES: Allergies  Allergen Reactions  . Bee Venom Anaphylaxis    HOME MEDICATIONS: Outpatient Medications Prior to Visit  Medication Sig Dispense Refill  . amLODipine (NORVASC) 10 MG  tablet Take 10 mg by mouth daily.    . citalopram (CELEXA) 40 MG tablet Take 40 mg by mouth daily.    . diclofenac (VOLTAREN) 75 MG EC tablet Take 75 mg by mouth at bedtime.    Marland Kitchen EPINEPHrine (EPIPEN 2-PAK) 0.3 mg/0.3 mL IJ SOAJ injection Inject 0.3 mLs (0.3 mg total) into the muscle once as needed (for severe allergic reaction). CAll 911 immediately if you have to use this medicine 1 Device 1  . gabapentin (NEURONTIN) 600 MG tablet Take 600 mg by mouth 2 (two) times daily.     . hydrochlorothiazide (MICROZIDE) 12.5 MG capsule Take 12.5 mg by mouth daily.    . metoprolol succinate (TOPROL-XL) 100 MG 24 hr tablet Take 50 mg by mouth daily. Take with or immediately following a meal.     . oxyCODONE (OXY IR/ROXICODONE) 5 MG immediate release tablet Take 5 mg by mouth every 12 (twelve) hours.    . pantoprazole (PROTONIX) 40 MG tablet Take 40 mg by mouth at bedtime.     No facility-administered medications prior to visit.    PAST MEDICAL HISTORY: Past Medical History:  Diagnosis Date  . Hypertension     PAST SURGICAL HISTORY: Past Surgical History:  Procedure Laterality Date  . ABDOMINAL HYSTERECTOMY    . CHOLECYSTECTOMY    . KNEE SURGERY    . REPLACEMENT TOTAL KNEE    . THYROIDECTOMY      FAMILY HISTORY: Family History  Problem Relation Age of Onset  . Diabetes Mother   . Hypertension Mother   . Healthy Father     SOCIAL HISTORY: Social History   Socioeconomic History  . Marital status: Soil scientist    Spouse name: Not on file  . Number of children: Not on file  . Years of education: Not on file  . Highest education level: Not on file  Occupational History  . Not on file  Tobacco Use  . Smoking status: Current Every Day Smoker    Packs/day: 0.50    Years: 25.00    Pack years: 12.50    Types: Cigarettes  . Smokeless tobacco: Never Used  Substance and Sexual Activity  . Alcohol use: Yes    Comment: occasional  . Drug use: Never  . Sexual activity: Not on file    Other Topics Concern  . Not on file  Social History Narrative  . Not on file   Social Determinants of Health   Financial Resource Strain:   . Difficulty of Paying Living Expenses: Not on file  Food Insecurity:   . Worried About Charity fundraiser in the Last Year: Not on file  . Ran Out of Food in the Last Year: Not on file  Transportation Needs:   . Lack of Transportation (Medical): Not on file  . Lack of Transportation (Non-Medical): Not on file  Physical Activity:   .  Days of Exercise per Week: Not on file  . Minutes of Exercise per Session: Not on file  Stress:   . Feeling of Stress : Not on file  Social Connections:   . Frequency of Communication with Friends and Family: Not on file  . Frequency of Social Gatherings with Friends and Family: Not on file  . Attends Religious Services: Not on file  . Active Member of Clubs or Organizations: Not on file  . Attends Archivist Meetings: Not on file  . Marital Status: Not on file  Intimate Partner Violence:   . Fear of Current or Ex-Partner: Not on file  . Emotionally Abused: Not on file  . Physically Abused: Not on file  . Sexually Abused: Not on file      PHYSICAL EXAM  Vitals:   12/04/19 1245  BP: 116/75  Pulse: 61  Temp: (!) 97.3 F (36.3 C)  TempSrc: Oral  Weight: 225 lb (102.1 kg)  Height: 5\' 6"  (1.676 m)   Body mass index is 36.32 kg/m.  Generalized: Well developed, in no acute distress  Cardiology: normal rate and rhythm, no murmur noted Respiratory: Clear to auscultation bilaterally Neurological examination  Mentation: Alert oriented to time, place, history taking. Follows all commands speech and language fluent Cranial nerve II-XII: Pupils were equal round reactive to light. Extraocular movements were full, visual field were full on confrontational test.  Motor: The motor testing reveals 5 over 5 strength of all 4 extremities. Good symmetric motor tone is noted throughout.  Gait and  station: Gait is normal.    DIAGNOSTIC DATA (LABS, IMAGING, TESTING) - I reviewed patient records, labs, notes, testing and imaging myself where available.  No flowsheet data found.   No results found for: WBC, HGB, HCT, MCV, PLT No results found for: NA, K, CL, CO2, GLUCOSE, BUN, CREATININE, CALCIUM, PROT, ALBUMIN, AST, ALT, ALKPHOS, BILITOT, GFRNONAA, GFRAA No results found for: CHOL, HDL, LDLCALC, LDLDIRECT, TRIG, CHOLHDL No results found for: HGBA1C No results found for: VITAMINB12 No results found for: TSH     ASSESSMENT AND PLAN 53 y.o. year old female  has a past medical history of Hypertension. here with     ICD-10-CM   1. OSA on CPAP  G47.33    Z99.89     Paula Brown presents for follow-up today.  She is tolerating CPAP therapy well.  She is uncertain if there has been any noted benefit in how she feels.  Fortunately she is not snoring any longer.  Epworth sleepiness scale is now reported as 20 out of 24 points.  Previously 11 out of 24 points.  Fatigue severity scale has improved, previously being 50/63 and now 42/63.  She continues to work swing shifts.  We have discussed many causes of fatigue.  I feel that her inconsistent work schedule and stress could be contributing.  She was encouraged to continue using CPAP therapy nightly and for greater than 4 hours each night.  Compliance report looks good.  She will call with any new or worsening symptoms.  She will follow-up in 1 year, sooner if needed.  She verbalizes understanding and agreement with this plan.   No orders of the defined types were placed in this encounter.    No orders of the defined types were placed in this encounter.     I spent 15 minutes with the patient. 50% of this time was spent counseling and educating patient on plan of care and medications.    Rossy Virag  Clark Clowdus, FNP-C 12/04/2019, 1:10 PM [facility] [address], [place], [place] [address] [phone]

## 2019-12-04 NOTE — Patient Instructions (Addendum)
Continue CPAP nightly and for greater than 4 hours each night    Follow up in 1 year, sooner if needed  Sleep Apnea Sleep apnea affects breathing during sleep. It causes breathing to stop for a short time or to become shallow. It can also increase the risk of:  Heart attack.  Stroke.  Being very overweight (obese).  Diabetes.  Heart failure.  Irregular heartbeat. The goal of treatment is to help you breathe normally again. What are the causes? There are three kinds of sleep apnea:  Obstructive sleep apnea. This is caused by a blocked or collapsed airway.  Central sleep apnea. This happens when the brain does not send the right signals to the muscles that control breathing.  Mixed sleep apnea. This is a combination of obstructive and central sleep apnea. The most common cause of this condition is a collapsed or blocked airway. This can happen if:  Your throat muscles are too relaxed.  Your tongue and tonsils are too large.  You are overweight.  Your airway is too small. What increases the risk?  Being overweight.  Smoking.  Having a small airway.  Being older.  Being female.  Drinking alcohol.  Taking medicines to calm yourself (sedatives or tranquilizers).  Having family members with the condition. What are the signs or symptoms?  Trouble staying asleep.  Being sleepy or tired during the day.  Getting angry a lot.  Loud snoring.  Headaches in the morning.  Not being able to focus your mind (concentrate).  Forgetting things.  Less interest in sex.  Mood swings.  Personality changes.  Feelings of sadness (depression).  Waking up a lot during the night to pee (urinate).  Dry mouth.  Sore throat. How is this diagnosed?  Your medical history.  A physical exam.  A test that is done when you are sleeping (sleep study). The test is most often done in a sleep lab but may also be done at home. How is this treated?   Sleeping on your  side.  Using a medicine to get rid of mucus in your nose (decongestant).  Avoiding the use of alcohol, medicines to help you relax, or certain pain medicines (narcotics).  Losing weight, if needed.  Changing your diet.  Not smoking.  Using a machine to open your airway while you sleep, such as: ? An oral appliance. This is a mouthpiece that shifts your lower jaw forward. ? A CPAP device. This device blows air through a mask when you breathe out (exhale). ? An EPAP device. This has valves that you put in each nostril. ? A BPAP device. This device blows air through a mask when you breathe in (inhale) and breathe out.  Having surgery if other treatments do not work. It is important to get treatment for sleep apnea. Without treatment, it can lead to:  High blood pressure.  Coronary artery disease.  In men, not being able to have an erection (impotence).  Reduced thinking ability. Follow these instructions at home: Lifestyle  Make changes that your doctor recommends.  Eat a healthy diet.  Lose weight if needed.  Avoid alcohol, medicines to help you relax, and some pain medicines.  Do not use any products that contain nicotine or tobacco, such as cigarettes, e-cigarettes, and chewing tobacco. If you need help quitting, ask your doctor. General instructions  Take over-the-counter and prescription medicines only as told by your doctor.  If you were given a machine to use while you sleep, use it  only as told by your doctor.  If you are having surgery, make sure to tell your doctor you have sleep apnea. You may need to bring your device with you.  Keep all follow-up visits as told by your doctor. This is important. Contact a doctor if:  The machine that you were given to use during sleep bothers you or does not seem to be working.  You do not get better.  You get worse. Get help right away if:  Your chest hurts.  You have trouble breathing in enough air.  You have  an uncomfortable feeling in your back, arms, or stomach.  You have trouble talking.  One side of your body feels weak.  A part of your face is hanging down. These symptoms may be an emergency. Do not wait to see if the symptoms will go away. Get medical help right away. Call your local emergency services (911 in the [place] Do not drive yourself to the hospital. Summary  This condition affects breathing during sleep.  The most common cause is a collapsed or blocked airway.  The goal of treatment is to help you breathe normally while you sleep. This information is not intended to replace advice given to you by your health care provider. Make sure you discuss any questions you have with your health care provider. Document Released: 09/12/2008 Document Revised: 09/20/2018 Document Reviewed: 07/30/2018 Elsevier Patient Education  2020 [employer].

## 2020-02-13 ENCOUNTER — Other Ambulatory Visit: Payer: Self-pay | Admitting: Internal Medicine

## 2020-02-13 DIAGNOSIS — R9082 White matter disease, unspecified: Secondary | ICD-10-CM

## 2020-02-16 ENCOUNTER — Other Ambulatory Visit: Payer: Self-pay | Admitting: Internal Medicine

## 2020-02-16 DIAGNOSIS — Z1231 Encounter for screening mammogram for malignant neoplasm of breast: Secondary | ICD-10-CM

## 2020-03-08 ENCOUNTER — Ambulatory Visit
Admission: RE | Admit: 2020-03-08 | Discharge: 2020-03-08 | Disposition: A | Discharge status: Home / Self Care | Payer: Managed Care, Other (non HMO) | Source: Ambulatory Visit | Attending: Internal Medicine | Admitting: Internal Medicine

## 2020-03-08 ENCOUNTER — Other Ambulatory Visit: Payer: Self-pay

## 2020-03-08 DIAGNOSIS — R9082 White matter disease, unspecified: Secondary | ICD-10-CM

## 2020-07-08 ENCOUNTER — Other Ambulatory Visit (HOSPITAL_COMMUNITY): Payer: Self-pay | Admitting: Internal Medicine

## 2020-07-08 DIAGNOSIS — G4733 Obstructive sleep apnea (adult) (pediatric): Secondary | ICD-10-CM

## 2020-07-08 DIAGNOSIS — F17200 Nicotine dependence, unspecified, uncomplicated: Secondary | ICD-10-CM

## 2020-07-08 DIAGNOSIS — R053 Chronic cough: Secondary | ICD-10-CM

## 2020-07-19 ENCOUNTER — Inpatient Hospital Stay (HOSPITAL_COMMUNITY): Admission: RE | Admit: 2020-07-19 | Payer: Managed Care, Other (non HMO) | Source: Ambulatory Visit

## 2020-08-17 ENCOUNTER — Other Ambulatory Visit: Payer: Self-pay | Admitting: Nephrology

## 2020-08-17 ENCOUNTER — Ambulatory Visit
Admission: RE | Admit: 2020-08-17 | Discharge: 2020-08-17 | Disposition: A | Discharge status: Home / Self Care | Payer: Managed Care, Other (non HMO) | Source: Ambulatory Visit | Attending: Nephrology | Admitting: Nephrology

## 2020-08-17 DIAGNOSIS — M170 Bilateral primary osteoarthritis of knee: Secondary | ICD-10-CM

## 2020-11-24 ENCOUNTER — Institutional Professional Consult (permissible substitution): Payer: Managed Care, Other (non HMO) | Admitting: Pulmonary Disease

## 2020-11-25 ENCOUNTER — Other Ambulatory Visit: Payer: Self-pay

## 2020-11-25 ENCOUNTER — Ambulatory Visit (INDEPENDENT_AMBULATORY_CARE_PROVIDER_SITE_OTHER): Payer: Managed Care, Other (non HMO) | Admitting: Internal Medicine

## 2020-11-25 ENCOUNTER — Encounter: Payer: Self-pay | Admitting: Internal Medicine

## 2020-11-25 VITALS — BP 140/70 | HR 70 | Temp 98.9°F | Ht 66.0 in | Wt 223.2 lb

## 2020-11-25 DIAGNOSIS — G4733 Obstructive sleep apnea (adult) (pediatric): Secondary | ICD-10-CM

## 2020-11-25 DIAGNOSIS — R059 Cough, unspecified: Secondary | ICD-10-CM | POA: Insufficient documentation

## 2020-11-25 MED ORDER — TRELEGY ELLIPTA 100-62.5-25 MCG/INH IN AEPB: 1.0000 | INHALATION_SPRAY | Freq: Every day | RESPIRATORY_TRACT | 0 refills | Status: DC

## 2020-11-25 NOTE — Patient Instructions (Signed)
Order- schedule PFT  Sample x 2 Trelegy 100  inhaler   Inhale 1 puff then rinse mouth, once daily  Try otc Chlortrimeton  1 at bedtime instead of benadryl  Try otc Nasalcrom./ cromolyn  Follow directions on box  Elevate the head end of your bed frame by putting 1 brick under each head leg. This will help keep stomach juice down hill.  Throat lozenges are fine  It is time to get serious about stopping smoking  Please call as needed

## 2020-11-25 NOTE — Progress Notes (Signed)
11/25/20-  40 yoF Smoker( 1/2 ppd/ 12.5 pk yrs) for sleep evaluation with concerns of  Cough, referred courtesy of [name], NP. PCP [name]. HST 2020 [facility]- AHI 32.6/ hr OSA managed on CPAP auto 6-16, 2 cm EPR by [facility] with pending 1 yr f/u 12/16. Medical problem list includes OSA, HTN, Obesity, Goiter/ partial thyroidectomy, Chronic Fatigue,  Meds include citalopram, oxycodone, gabapentin 600 bid MR Brain 3/22 showed mild to moderate chronic microvascular ischemic changes. Reportedly unable to wear CPAP x 6 months due to cough- has had Allergy w/u, Endoscopy, is on PPI, is not on ACE inhibitor. Had ED for acute bronchitis 2019. CXR reported no Dz. Covid vax- 2 Phizer Flu vax- none Epworth score 20 Coughing since April. Bad at night. Worsened by anxiety. 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 Wakes at night with cough. There may be some post-nasal drip component, but she also notes nasal congestion with hard cough. Denies seasonal pattern after this started in April. Nothing similar in past years. No hx asthma. Allergist did simple spirometry, gave an inhaler she only used once "no help". [name] did EGD and said she looked normal.  Prednisone helped. Witnessed Episode: After deep breaths for exam here, she developed typical episode of spastic hoarseness, then loose, vibratory cough with eyes watering, slight nasal congestion, stridor. Went to bathroom to retch.    Prior to Admission medications   Medication Sig Start Date End Date Taking? Authorizing Provider  amLODipine (NORVASC) 10 MG tablet Take 10 mg by mouth daily.   Yes [provider]  citalopram (CELEXA) 40 MG tablet Take 40 mg by mouth daily.   Yes [provider]  diclofenac (VOLTAREN) 75 MG EC tablet Take 75 mg by mouth at bedtime. 11/27/19  Yes [provider]  EPINEPHrine (EPIPEN 2-PAK) 0.3 mg/0.3 mL IJ SOAJ injection Inject 0.3  mLs (0.3 mg total) into the muscle once as needed (for severe allergic reaction). CAll 911 immediately if you have to use this medicine 05/27/17  Yes Waynetta Pean, PA-C  gabapentin (NEURONTIN) 600 MG tablet Take 600 mg by mouth 2 (two) times daily.    Yes [provider]  hydrochlorothiazide (MICROZIDE) 12.5 MG capsule Take 12.5 mg by mouth daily.   Yes [provider]  metoprolol succinate (TOPROL-XL) 100 MG 24 hr tablet Take 50 mg by mouth daily. Take with or immediately following a meal.   Yes [provider]  oxyCODONE (OXY IR/ROXICODONE) 5 MG immediate release tablet Take 5 mg by mouth every 12 (twelve) hours. 09/05/19  Yes [provider]  pantoprazole (PROTONIX) 40 MG tablet Take 40 mg by mouth at bedtime. 10/09/19  Yes [provider]  Fluticasone-Umeclidin-Vilant (TRELEGY ELLIPTA) 100-62.5-25 MCG/INH AEPB Inhale 1 puff into the lungs daily. 11/25/20   Deneise Lever, MD   Past Medical History:  Diagnosis Date  . Hypertension    Past Surgical History:  Procedure Laterality Date  . ABDOMINAL HYSTERECTOMY    . CHOLECYSTECTOMY    . KNEE SURGERY    . REPLACEMENT TOTAL KNEE    . THYROIDECTOMY     Social History   Socioeconomic History  . Marital status: Significant Other    Spouse name: Not on file  . Number of children: Not on file  . Years of education: Not on file  . Highest education level: Not on file  Occupational History  . Not on file  Tobacco Use  .  Smoking status: Current Every Day Smoker    Packs/day: 0.50    Years: 25.00    Pack years: 12.50    Types: Cigarettes  . Smokeless tobacco: Never Used  . Tobacco comment: 10 cigarettes a day  Vaping Use  . Vaping Use: Never used  Substance and Sexual Activity  . Alcohol use: Yes    Comment: occasional  . Drug use: Never  . Sexual activity: Not on file  Other Topics Concern  . Not on file  Social History Narrative  . Not on file   Social Determinants of Health    Financial Resource Strain: Not on file  Food Insecurity: Not on file  Transportation Needs: Not on file  Physical Activity: Not on file  Stress: Not on file  Social Connections: Not on file  Intimate Partner Violence: Not on file   Family History  Problem Relation Age of Onset  . Diabetes Mother   . Hypertension Mother   . Healthy Father    ROS-see HPI  + = positive  Constitutional:    weight loss, night sweats, fevers, chills, fatigue, lassitude. HEENT:    headaches, difficulty swallowing, +tooth/dental problems, +sore throat,       +sneezing, +itching, ear ache, +nasal congestion, post nasal drip, snoring CV:    chest pain, orthopnea, PND, swelling in lower extremities, anasarca,                                  dizziness, palpitations Resp:   +shortness of breath with exertion or at rest.                +productive cough,   +non-productive cough, coughing up of blood.              change in color of mucus.  wheezing.   Skin:    rash or lesions. GI:  No-   heartburn, indigestion, abdominal pain, nausea, vomiting, diarrhea,                 change in bowel habits, loss of appetite GU: dysuria, change in color of urine, no urgency or frequency.   flank pain. MS:   joint pain, +stiffness, decreased range of motion, back pain. Neuro-     nothing unusual Psych:  change in mood or affect.  depression or +anxiety.   memory loss.  OBJ- Physical Exam    +Note description of cough episode today in HPI above. General- Alert, Oriented, Affect-appropriate, Distress- none acute Skin- rash-none, lesions- none, excoriation- none Lymphadenopathy- none Head- atraumatic            Eyes- Gross vision intact, PERRLA, conjunctivae and secretions clear            Ears- Hearing, canals-normal            Nose- Clear, no-Septal dev, mucus, polyps, erosion, perforation             Throat- Mallampati III , mucosa+ slightly red , drainage- none, tonsils- atrophic Neck- flexible , trachea midline, no  stridor , thyroid nl, carotid no bruit Chest - symmetrical excursion , unlabored           Heart/CV- RRR , no murmur , no gallop  , no rub, nl s1 s2                           - JVD- none ,  edema- none, stasis changes- none, varices- none           Lung-  Wheeze+trace R scapula, cough+, dullness-none, rub- none           Chest wall-  Abd-  Br/ Gen/ Rectal- Not done, not indicated Extrem- cyanosis- none, clubbing, none, atrophy- none, strength- nl Neuro- grossly intact to observation

## 2020-11-25 NOTE — Assessment & Plan Note (Signed)
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 Not clear if there is a bronchospastic component- she didn't try inhaler long enough before. She has been trying brief, individual therapies. Plan- PFT (may need methacholine test), elevate head of bed, try Trelegy daily x 2 weeks, smoking cessation, soothing throat lozenges. Nasalcrom nasal spray and hs chlortrimeton to dry up drainage.

## 2020-11-25 NOTE — Assessment & Plan Note (Signed)
She says she loves her CPAP and wants the coughing to stop so she can get back to it.  Managed by [facility].

## 2020-12-02 ENCOUNTER — Ambulatory Visit: Payer: Managed Care, Other (non HMO) | Admitting: Family Medicine

## 2020-12-20 NOTE — Progress Notes (Deleted)
11/25/20-  89 yoF Smoker( 1/2 ppd/ 12.5 pk yrs) for sleep evaluation with concerns of  Cough, referred courtesy of [name], NP. PCP [name]. HST 2020 [facility]- AHI 32.6/ hr OSA managed on CPAP auto 6-16, 2 cm EPR by [facility] with pending 1 yr f/u 12/16. Medical problem list includes OSA, HTN, Obesity, Goiter/ partial thyroidectomy, Chronic Fatigue,  Meds include citalopram, oxycodone, gabapentin 600 bid MR Brain 3/22 showed mild to moderate chronic microvascular ischemic changes. Reportedly unable to wear CPAP x 6 months due to cough- has had Allergy w/u, Endoscopy, is on PPI, is not on ACE inhibitor. Had ED for acute bronchitis 2019. CXR reported no Dz. Covid vax- 2 Phizer Flu vax- none Epworth score 20 Coughing since April. Bad at night. Worsened by anxiety. 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 Wakes at night with cough. There may be some post-nasal drip component, but she also notes nasal congestion with hard cough. Denies seasonal pattern after this started in April. Nothing similar in past years. No hx asthma. Allergist did simple spirometry, gave an inhaler she only used once "no help". [name] did EGD and said she looked normal.  Prednisone helped. Witnessed Episode: After deep breaths for exam here, she developed typical episode of spastic hoarseness, then loose, vibratory cough with eyes watering, slight nasal congestion, stridor. Went to bathroom to retch.   12/21/20- 66 yoF Smoker followed for Cough, complicated by OSA( Neurology), HTN, Obesity, Goiter/ partial thyroidectomy, Chronic Fatigue We had dx'd multifactorial with Upper Airway Cough, VCD, tobacco, rhinitis and  Micro-reflux. We suggested elevate HOB, Trelegy samples, QS, nasalcrom/ chlortrimeton PFT scheduled 12/9- not done   ROS-see HPI  + = positive  Constitutional:    weight loss, night sweats, fevers, chills, fatigue, lassitude. HEENT:    headaches,  difficulty swallowing, +tooth/dental problems, +sore throat,       +sneezing, +itching, ear ache, +nasal congestion, post nasal drip, snoring CV:    chest pain, orthopnea, PND, swelling in lower extremities, anasarca,                                  dizziness, palpitations Resp:   +shortness of breath with exertion or at rest.                +productive cough,   +non-productive cough, coughing up of blood.              change in color of mucus.  wheezing.   Skin:    rash or lesions. GI:  No-   heartburn, indigestion, abdominal pain, nausea, vomiting, diarrhea,                 change in bowel habits, loss of appetite GU: dysuria, change in color of urine, no urgency or frequency.   flank pain. MS:   joint pain, +stiffness, decreased range of motion, back pain. Neuro-     nothing unusual Psych:  change in mood or affect.  depression or +anxiety.   memory loss.  OBJ- Physical Exam    +Note description of cough episode today in HPI above. General- Alert, Oriented, Affect-appropriate, Distress- none acute Skin- rash-none, lesions- none, excoriation- none Lymphadenopathy- none Head- atraumatic            Eyes- Gross vision intact, PERRLA, conjunctivae and secretions clear  Ears- Hearing, canals-normal            Nose- Clear, no-Septal dev, mucus, polyps, erosion, perforation             Throat- Mallampati III , mucosa+ slightly red , drainage- none, tonsils- atrophic Neck- flexible , trachea midline, no stridor , thyroid nl, carotid no bruit Chest - symmetrical excursion , unlabored           Heart/CV- RRR , no murmur , no gallop  , no rub, nl s1 s2                           - JVD- none , edema- none, stasis changes- none, varices- none           Lung-  Wheeze+trace R scapula, cough+, dullness-none, rub- none           Chest wall-  Abd-  Br/ Gen/ Rectal- Not done, not indicated Extrem- cyanosis- none, clubbing, none, atrophy- none, strength- nl Neuro- grossly intact to  observation

## 2020-12-21 ENCOUNTER — Ambulatory Visit: Payer: Managed Care, Other (non HMO) | Admitting: Internal Medicine

## 2021-04-11 IMAGING — DX DG KNEE 1-2V*R*
2 series · 2 of 2 positions shown · non-contrast
Comparison: None.

CLINICAL DATA: Pain and swelling in right knee

EXAM:
RIGHT KNEE - 1-2 VIEW

[dg knee 1-2 views right (1 of 2)]
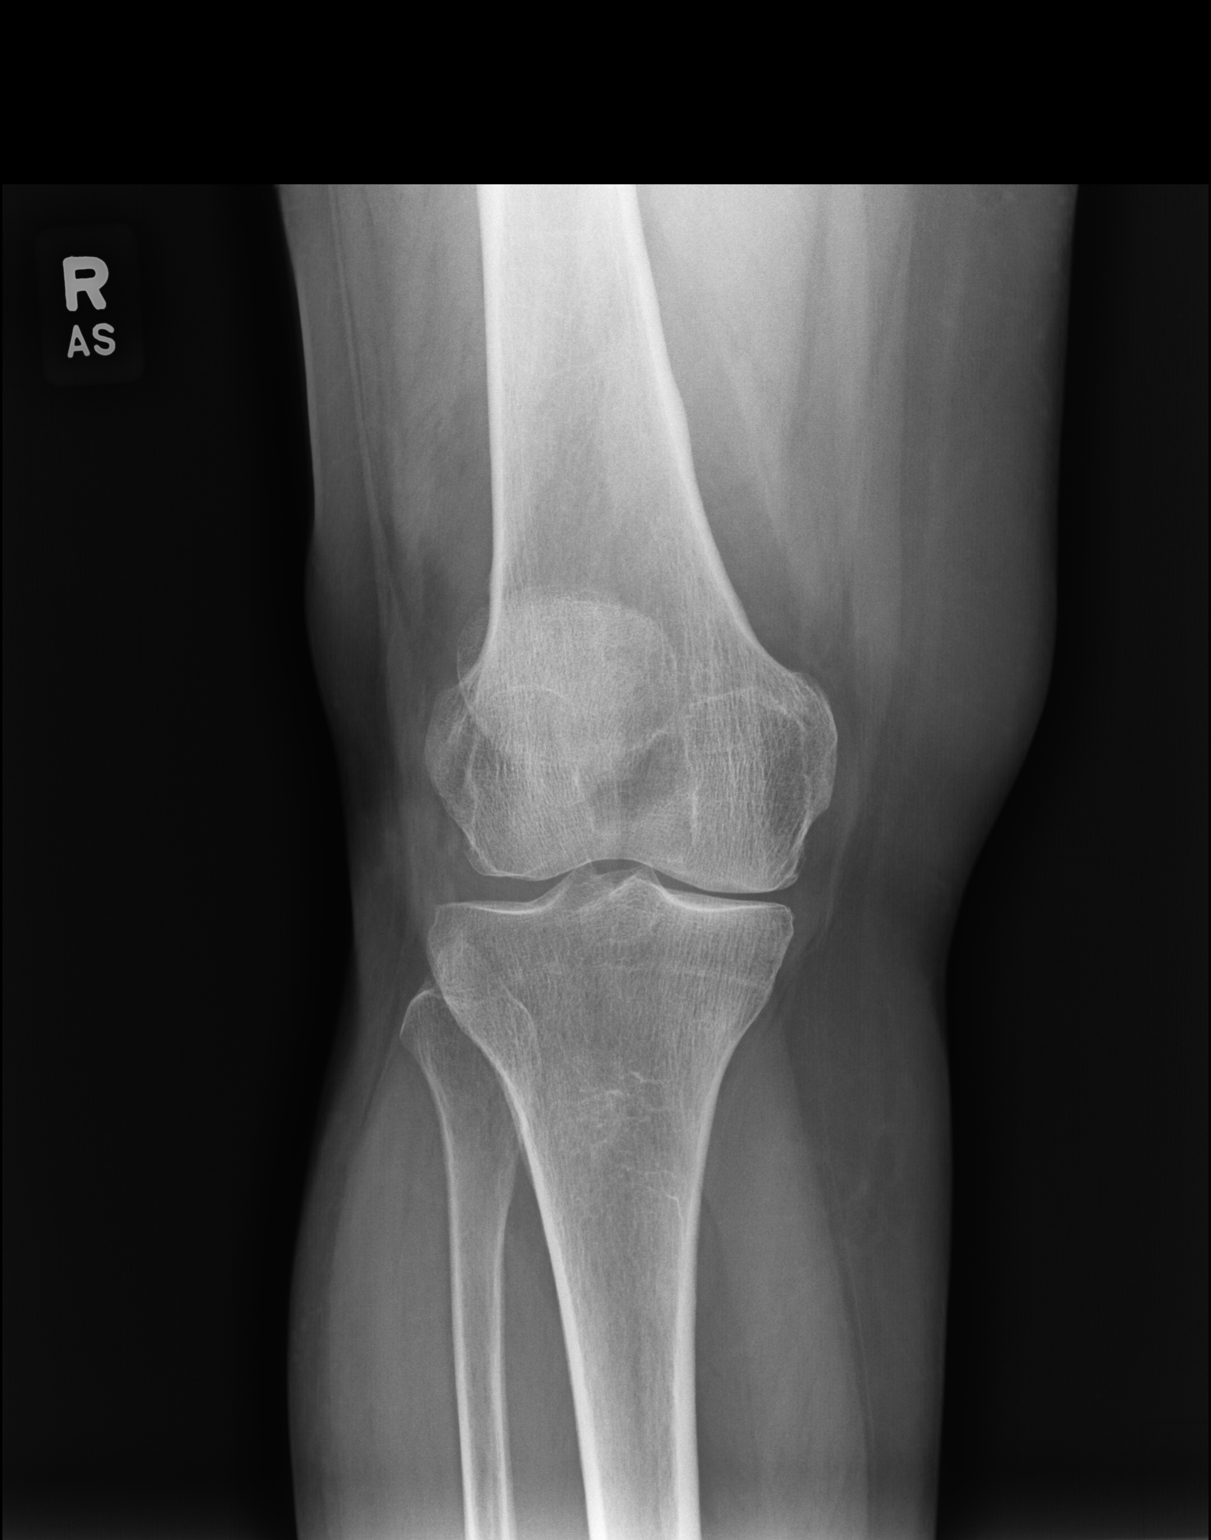

[dg knee 1-2 views right (2 of 2)]
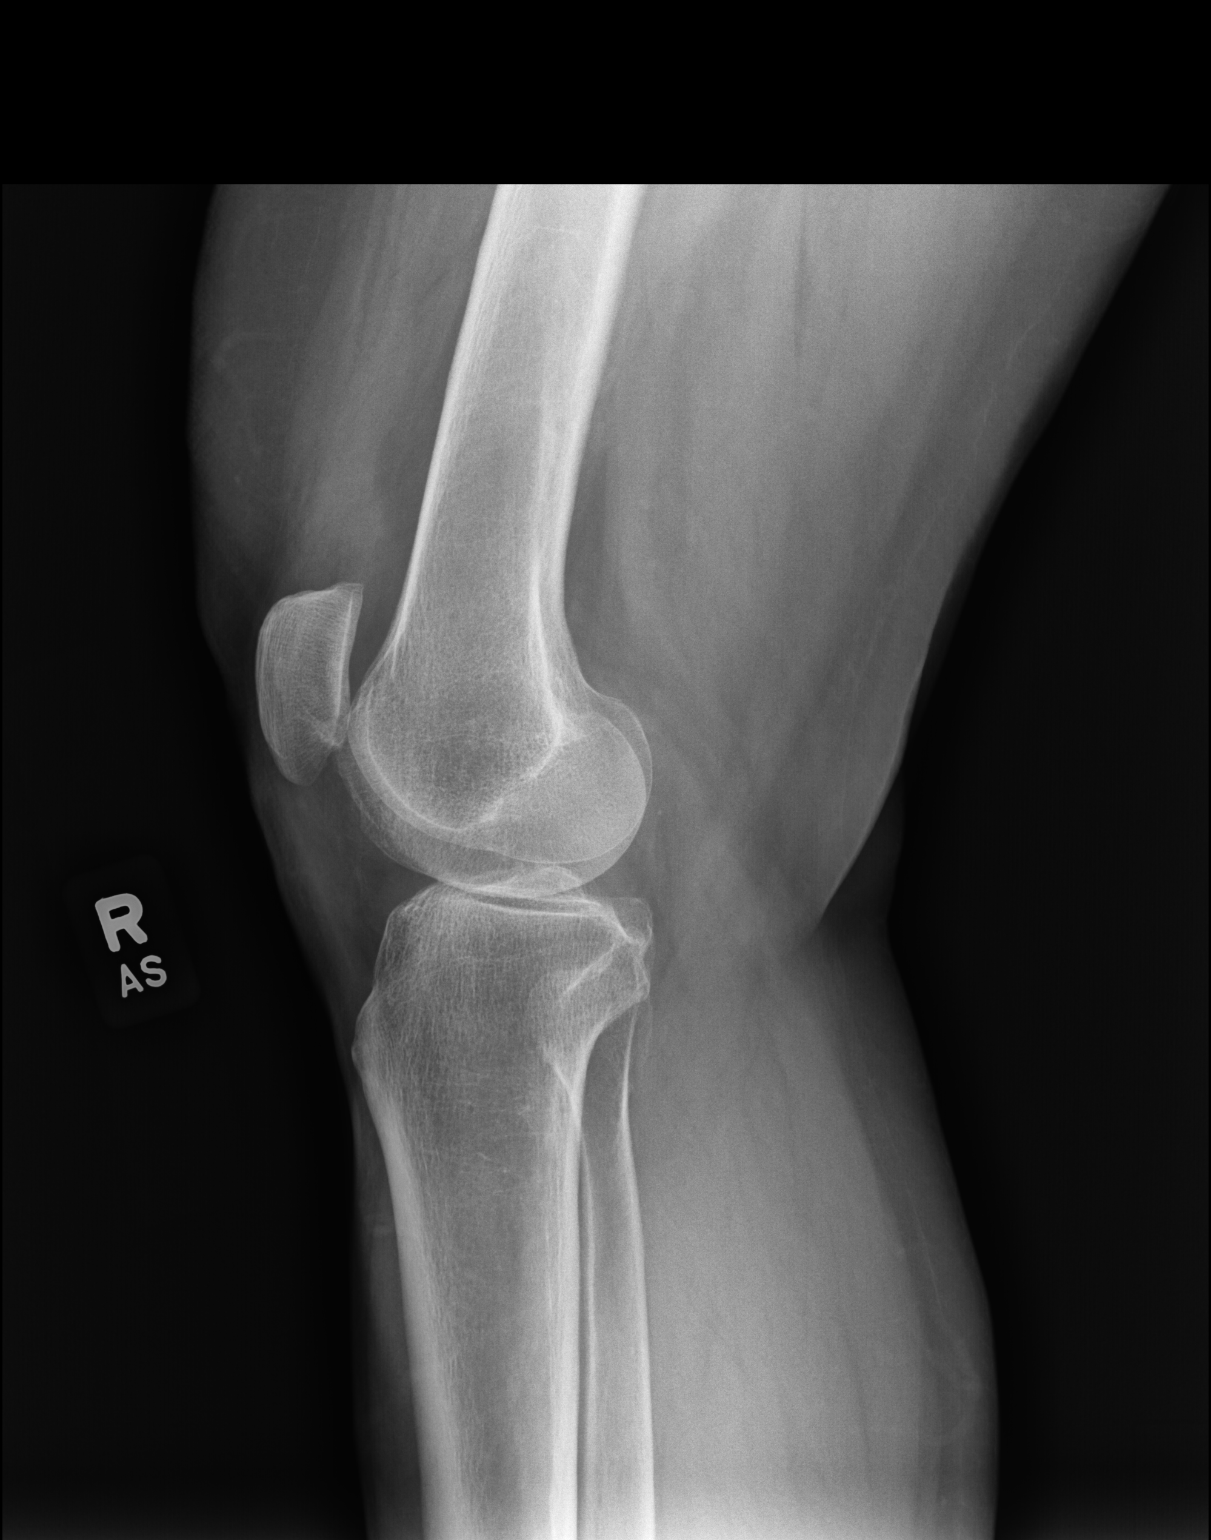

[2 of 2 positions shown; findings below may reference images not displayed]

FINDINGS: Frontal and lateral views of the right knee demonstrate no fracture,
subluxation, or dislocation. There is mild to moderate medial
compartmental joint space narrowing consistent with osteoarthritis.
No joint effusion. Soft tissues are normal.
IMPRESSION: 1. Mild to moderate medial compartmental osteoarthritis.

## 2021-04-25 ENCOUNTER — Emergency Department: Admit: 2021-04-25 | Payer: PRIVATE HEALTH INSURANCE

## 2021-04-25 ENCOUNTER — Inpatient Hospital Stay
Admit: 2021-04-25 | Discharge: 2021-04-25 | Disposition: A | Discharge status: Home / Self Care | Payer: PRIVATE HEALTH INSURANCE | Attending: Emergency Medicine

## 2021-04-25 DIAGNOSIS — M25561 Pain in right knee: Secondary | ICD-10-CM

## 2021-04-25 MED ORDER — CYCLOBENZAPRINE HCL 5 MG PO TABS
5 MG | ORAL_TABLET | Freq: Two times a day (BID) | ORAL | 0 refills | Status: AC | PRN
Start: 2021-04-25 — End: 2021-05-05

## 2021-04-25 MED ORDER — KETOROLAC TROMETHAMINE 15 MG/ML IJ SOLN
15 MG/ML | Freq: Once | INTRAMUSCULAR | Status: AC
Start: 2021-04-25 — End: 2021-04-25
  Administered 2021-04-25: 07:00:00 via INTRAMUSCULAR

## 2021-04-25 MED ORDER — ORPHENADRINE CITRATE 30 MG/ML IJ SOLN
30 MG/ML | Freq: Once | INTRAMUSCULAR | Status: AC
Start: 2021-04-25 — End: 2021-04-25
  Administered 2021-04-25: 07:00:00 via INTRAMUSCULAR

## 2021-04-25 MED ORDER — DEXAMETHASONE SODIUM PHOSPHATE 10 MG/ML IJ SOLN
10 MG/ML | Freq: Once | INTRAMUSCULAR | Status: AC
Start: 2021-04-25 — End: 2021-04-25
  Administered 2021-04-25: 07:00:00 via INTRAMUSCULAR

## 2021-04-25 MED ORDER — KETOROLAC TROMETHAMINE 10 MG PO TABS
10 MG | ORAL_TABLET | Freq: Four times a day (QID) | ORAL | 0 refills | Status: AC | PRN
Start: 2021-04-25 — End: 2021-04-30

## 2021-04-25 MED FILL — KETOROLAC TROMETHAMINE 15 MG/ML IJ SOLN: 15 mg/mL | INTRAMUSCULAR | Qty: 1

## 2021-04-25 MED FILL — DEXAMETHASONE SODIUM PHOSPHATE 10 MG/ML IJ SOLN: 10 mg/mL | INTRAMUSCULAR | Qty: 1

## 2021-04-25 MED FILL — ORPHENADRINE CITRATE 30 MG/ML IJ SOLN: 30 mg/mL | INTRAMUSCULAR | Qty: 2
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
## 2021-11-23 NOTE — Progress Notes (Signed)
11/25/20-  19 yoF Smoker( 1/2 ppd/ 12.5 pk yrs) for sleep evaluation with concerns of  Cough, referred courtesy of [name], NP. PCP [name]. HST 2020 [facility]- AHI 32.6/ hr OSA managed on CPAP auto 6-16, 2 cm EPR by [facility] with pending 1 yr f/u 12/16. Medical problem list includes OSA, HTN, Obesity, Goiter/ partial thyroidectomy, Chronic Fatigue,  Meds include citalopram, oxycodone, gabapentin 600 bid MR Brain 3/22 showed mild to moderate chronic microvascular ischemic changes. Reportedly unable to wear CPAP x 6 months due to cough- has had Allergy w/u, Endoscopy, is on PPI, is not on ACE inhibitor. Had ED for acute bronchitis 2019. CXR reported no Dz. Covid vax- 2 Phizer Flu vax- none Epworth score 20 Coughing since April. Bad at night. Worsened by anxiety. 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 Wakes at night with cough. There may be some post-nasal drip component, but she also notes nasal congestion with hard cough. Denies seasonal pattern after this started in April. Nothing similar in past years. No hx asthma. Allergist did simple spirometry, gave an inhaler she only used once "no help". [name] did EGD and said she looked normal.  Prednisone helped. Witnessed Episode: After deep breaths for exam here, she developed typical episode of spastic hoarseness, then loose, vibratory cough with eyes watering, slight nasal congestion, stridor. Went to bathroom to retch.   11/24/21- 58 yoF Smoker (12.5 pk yrs) followed for chronic cough, complicated by OSA([facility]), HTN, Obesity, Goiter/ partial thyroidectomy, Chronic Fatigue, - Trelegy 100, Gabapentin, omeprazole,  Covid vax 2 Moderna Flu vax-had -----Patient reports her cough is about the same and its non productive. Still smoking 1/2 pack/day.  [name] gave prednisone for URI-helped.  Did not recognize much benefit from Trelegy.  We discussed use of inhaled steroid if systemic  steroid helped.  Bending, stretching, reaching trigger her cough.  Chronic omeprazole.  Daily Claritin helps some. PFT 11/24/21-minimal obstruction, insignificant response to dilator, normal lung volumes, normal DLCO  ROS-see HPI  + = positive  Constitutional:    weight loss, night sweats, fevers, chills, fatigue, lassitude. HEENT:    headaches, difficulty swallowing, +tooth/dental problems, +sore throat,       +sneezing, +itching, ear ache, +nasal congestion, post nasal drip, snoring CV:    chest pain, orthopnea, PND, swelling in lower extremities, anasarca,                                   dizziness, palpitations Resp:   +shortness of breath with exertion or at rest.                +productive cough,   +non-productive cough, coughing up of blood.              change in color of mucus.  wheezing.   Skin:    rash or lesions. GI:  No-   heartburn, indigestion, abdominal pain, nausea, vomiting, diarrhea,                 change in bowel habits, loss of appetite GU: dysuria, change in color of urine, no urgency or frequency.   flank pain. MS:   joint pain, +stiffness, decreased range of motion, back pain. Neuro-     nothing unusual Psych:  change in mood or affect.  depression or +anxiety.   memory loss.  OBJ- Physical Exam  General- Alert, Oriented, Affect-appropriate, Distress- none acute Skin- rash-none, lesions- none, excoriation- none Lymphadenopathy- none Head- atraumatic            Eyes- Gross vision intact, PERRLA, conjunctivae and secretions clear            Ears- Hearing, canals-normal            Nose- Clear, no-Septal dev, mucus, polyps, erosion, perforation             Throat- Mallampati III , mucosa+ slightly red , drainage- none, tonsils- atrophic Neck- flexible , trachea midline, no stridor , thyroid nl, carotid no bruit Chest - symmetrical excursion , unlabored           Heart/CV- RRR , no murmur , no gallop  , no rub, nl s1 s2                           - JVD- none ,  edema- none, stasis changes- none, varices- none           Lung-  Wheeze+trace R scapula, cough-none, dullness-none, rub- none           Chest wall-  Abd-  Br/ Gen/ Rectal- Not done, not indicated Extrem- cyanosis- none, clubbing, none, atrophy- none, strength- nl Neuro- grossly intact to observation

## 2021-11-24 ENCOUNTER — Encounter: Payer: Self-pay | Admitting: Internal Medicine

## 2021-11-24 ENCOUNTER — Other Ambulatory Visit: Payer: Self-pay

## 2021-11-24 ENCOUNTER — Ambulatory Visit (INDEPENDENT_AMBULATORY_CARE_PROVIDER_SITE_OTHER): Payer: BC Managed Care – PPO | Admitting: Internal Medicine

## 2021-11-24 DIAGNOSIS — R059 Cough, unspecified: Secondary | ICD-10-CM | POA: Diagnosis not present

## 2021-11-24 DIAGNOSIS — G4733 Obstructive sleep apnea (adult) (pediatric): Secondary | ICD-10-CM

## 2021-11-24 LAB — PULMONARY FUNCTION TEST
DL/VA % pred: 113 %
DL/VA: 4.73 ml/min/mmHg/L
DLCO cor % pred: 108 %
DLCO cor: 23.72 ml/min/mmHg
DLCO unc % pred: 108 %
DLCO unc: 23.72 ml/min/mmHg
FEF 25-75 Post: 2.26 L/sec
FEF 25-75 Pre: 2.12 L/sec
FEF2575-%Change-Post: 6 %
FEF2575-%Pred-Post: 87 %
FEF2575-%Pred-Pre: 81 %
FEV1-%Change-Post: 0 %
FEV1-%Pred-Post: 82 %
FEV1-%Pred-Pre: 82 %
FEV1-Post: 2.35 L
FEV1-Pre: 2.33 L
FEV1FVC-%Change-Post: 5 %
FEV1FVC-%Pred-Pre: 98 %
FEV6-%Change-Post: -7 %
FEV6-%Pred-Post: 79 %
FEV6-%Pred-Pre: 85 %
FEV6-Post: 2.79 L
FEV6-Pre: 3.01 L
FEV6FVC-%Pred-Post: 103 %
FEV6FVC-%Pred-Pre: 103 %
FVC-%Change-Post: -4 %
FVC-%Pred-Post: 78 %
FVC-%Pred-Pre: 82 %
FVC-Post: 2.87 L
FVC-Pre: 3.01 L
Post FEV1/FVC ratio: 82 %
Post FEV6/FVC ratio: 100 %
Pre FEV1/FVC ratio: 77 %
Pre FEV6/FVC Ratio: 100 %
RV % pred: 111 %
RV: 2.27 L
TLC % pred: 102 %
TLC: 5.48 L

## 2021-11-24 MED ORDER — FLUTICASONE PROPIONATE HFA 110 MCG/ACT IN AERO: INHALATION_SPRAY | RESPIRATORY_TRACT | 12 refills | Status: AC

## 2021-11-24 NOTE — Assessment & Plan Note (Signed)
Reactive airway, chronic bronchitis and possible gastric reflux Plan-Zzquill at hs to help sleep, Flovent 110, continue claritin, smoking cessation

## 2021-11-24 NOTE — Patient Instructions (Signed)
Full PFT performed today. °

## 2021-11-24 NOTE — Assessment & Plan Note (Signed)
She has her machine and is willing to start back at previous setting (6-16).  She had dropped off which she could not control cough.

## 2021-11-24 NOTE — Patient Instructions (Signed)
Script sent for Flovent 110 inhaler       inhale 2 puffs, then rinse mouth, twice daily  After using Flovent for a few days to get it started, try going back on your CPAP  Ok to add Zzquill at bedtime for help sleeping  Order- refer to Pharmacy Smoking cessation program

## 2021-11-24 NOTE — Progress Notes (Signed)
Full PFT performed today. °

## 2022-03-29 NOTE — Progress Notes (Deleted)
HPI ?F Smoker (12.5 pk yrs) followed for chronic cough, complicated by OSA([facility]), HTN, Obesity, Goiter/ partial thyroidectomy, Chronic Fatigue, ?PFT 11/24/21-minimal obstruction, insignificant response to dilator, normal lung volumes, normal DLCO ? ?============================================================= ? ?11/25/20-  64 yoF Smoker( 1/2 ppd/ 12.5 pk yrs) for sleep evaluation with concerns of  Cough, referred courtesy of [name], NP. PCP [name]. ?HST 2020 [facility]- AHI 32.6/ hr ?Allergist did simple spirometry, gave an inhaler she only used once "no help". ?[name] did EGD and said she looked normal.  ?Prednisone helped. ?Witnessed Episode: After deep breaths for exam here, she developed typical episode of spastic hoarseness, then loose, vibratory cough with eyes watering, slight nasal congestion, stridor. Went to bathroom to retch.  ? ?11/24/21- 55 yoF Smoker (12.5 pk yrs) followed for chronic cough, complicated by OSA([facility]), HTN, Obesity, Goiter/ partial thyroidectomy, Chronic Fatigue, ?- Trelegy 100, Gabapentin, omeprazole,  ?Covid vax 2 Moderna ?Flu vax-had ?-----Patient reports her cough is about the same and its non productive. ?Still smoking 1/2 pack/day.  [name] gave prednisone for URI-helped.  Did not recognize much benefit from Trelegy.  We discussed use of inhaled steroid if systemic steroid helped.  Bending, stretching, reaching trigger her cough.  Chronic omeprazole.  Daily Claritin helps some. ?PFT 11/24/21-minimal obstruction, insignificant response to dilator, normal lung volumes, normal DLCO ? ?03/30/22- 23 yoF Smoker (12.5 pk yrs) followed for chronic Cough, Tobacco Abuse,  complicated by OSA([facility]), HTN, Obesity, Goiter/ partial thyroidectomy, Chronic Fatigue, ?- Trelegy 100, Gabapentin, omeprazole,  ?Covid vax 2 Moderna ?Flu vax-had ? ?ROS-see HPI  + = positive  ?Constitutional:    weight loss, night sweats, fevers, chills, fatigue, lassitude. ?HEENT:    headaches, difficulty swallowing,  +tooth/dental problems, +sore throat,  ?     +sneezing, +itching, ear ache, +nasal congestion, post nasal drip, snoring ?CV:    chest pain, orthopnea, PND, swelling in lower extremities, anasarca,                                   ?dizziness, palpitations ?Resp:   +shortness of breath with exertion or at rest.   ?             +productive cough,   +non-productive cough, coughing up of blood.   ?           change in color of mucus.  wheezing.   ?Skin:    rash or lesions. ?GI:  No-   heartburn, indigestion, abdominal pain, nausea, vomiting, diarrhea,  ?               change in bowel habits, loss of appetite ?GU: dysuria, change in color of urine, no urgency or frequency.   flank pain. ?MS:   joint pain, +stiffness, decreased range of motion, back pain. ?Neuro-     nothing unusual ?Psych:  change in mood or affect.  depression or +anxiety.   memory loss. ? ?OBJ- Physical Exam     ?General- Alert, Oriented, Affect-appropriate, Distress- none acute ?Skin- rash-none, lesions- none, excoriation- none ?Lymphadenopathy- none ?Head- atraumatic ?           Eyes- Gross vision intact, PERRLA, conjunctivae and secretions clear ?           Ears- Hearing, canals-normal ?           Nose- Clear, no-Septal dev, mucus, polyps, erosion, perforation  ?  Throat- Mallampati III , mucosa+ slightly red , drainage- none, tonsils- atrophic ?Neck- flexible , trachea midline, no stridor , thyroid nl, carotid no bruit ?Chest - symmetrical excursion , unlabored ?          Heart/CV- RRR , no murmur , no gallop  , no rub, nl s1 s2 ?                          - JVD- none , edema- none, stasis changes- none, varices- none ?          Lung-  Wheeze+trace R scapula, cough-none, dullness-none, rub- none ?          Chest wall-  ?Abd-  ?Br/ Gen/ Rectal- Not done, not indicated ?Extrem- cyanosis- none, clubbing, none, atrophy- none, strength- nl ?Neuro- grossly intact to observation ? ? ? ? ?

## 2022-03-30 ENCOUNTER — Ambulatory Visit: Payer: Managed Care, Other (non HMO) | Admitting: Internal Medicine

## 2022-04-22 NOTE — Progress Notes (Deleted)
HPI F Smoker (12.5 pk yrs) followed for chronic cough, complicated by OSA(GNA), HTN, Obesity, Goiter/ partial thyroidectomy, Chronic Fatigue, PFT 11/24/21-minimal obstruction, insignificant response to dilator, normal lung volumes, normal DLCO  =============================================================   11/24/21- 68 yoF Smoker (12.5 pk yrs) followed for chronic cough, complicated by OSA(GNA), HTN, Obesity, Goiter/ partial thyroidectomy, Chronic Fatigue, - Trelegy 100, Gabapentin, omeprazole,  Covid vax 2 Moderna Flu vax-had -----Patient reports her cough is about the same and its non productive. Still smoking 1/2 pack/day.  [name] gave prednisone for URI-helped.  Did not recognize much benefit from Trelegy.  We discussed use of inhaled steroid if systemic steroid helped.  Bending, stretching, reaching trigger her cough.  Chronic omeprazole.  Daily Claritin helps some. PFT 11/24/21-minimal obstruction, insignificant response to dilator, normal lung volumes, normal DLCO  04/25/22- 89 yoF Smoker (12.5 pk yrs) followed for chronic Cough, Tobacco Abuse,  complicated by OSA(GNA), HTN, Obesity, Goiter/ partial thyroidectomy, Chronic Fatigue, - Trelegy 100, Gabapentin, omeprazole,  Covid vax 2 Moderna Flu vax-had   ROS-see HPI  + = positive  Constitutional:    weight loss, night sweats, fevers, chills, fatigue, lassitude. HEENT:    headaches, difficulty swallowing, +tooth/dental problems, +sore throat,       +sneezing, +itching, ear ache, +nasal congestion, post nasal drip, snoring CV:    chest pain, orthopnea, PND, swelling in lower extremities, anasarca,                                   dizziness, palpitations Resp:   +shortness of breath with exertion or at rest.                +productive cough,   +non-productive cough, coughing up of blood.              change in color of mucus.  wheezing.   Skin:    rash or lesions. GI:  No-   heartburn, indigestion, abdominal pain, nausea,  vomiting, diarrhea,                 change in bowel habits, loss of appetite GU: dysuria, change in color of urine, no urgency or frequency.   flank pain. MS:   joint pain, +stiffness, decreased range of motion, back pain. Neuro-     nothing unusual Psych:  change in mood or affect.  depression or +anxiety.   memory loss.  OBJ- Physical Exam     General- Alert, Oriented, Affect-appropriate, Distress- none acute Skin- rash-none, lesions- none, excoriation- none Lymphadenopathy- none Head- atraumatic            Eyes- Gross vision intact, PERRLA, conjunctivae and secretions clear            Ears- Hearing, canals-normal            Nose- Clear, no-Septal dev, mucus, polyps, erosion, perforation             Throat- Mallampati III , mucosa+ slightly red , drainage- none, tonsils- atrophic Neck- flexible , trachea midline, no stridor , thyroid nl, carotid no bruit Chest - symmetrical excursion , unlabored           Heart/CV- RRR , no murmur , no gallop  , no rub, nl s1 s2                           - JVD- none , edema- none,  stasis changes- none, varices- none           Lung-  Wheeze+trace R scapula, cough-none, dullness-none, rub- none           Chest wall-  Abd-  Br/ Gen/ Rectal- Not done, not indicated Extrem- cyanosis- none, clubbing, none, atrophy- none, strength- nl Neuro- grossly intact to observation

## 2022-04-25 ENCOUNTER — Ambulatory Visit: Payer: Managed Care, Other (non HMO) | Admitting: Internal Medicine

## 2022-06-30 ENCOUNTER — Other Ambulatory Visit: Payer: Self-pay | Admitting: Internal Medicine

## 2022-06-30 DIAGNOSIS — Z0000 Encounter for general adult medical examination without abnormal findings: Secondary | ICD-10-CM

## 2022-07-07 ENCOUNTER — Ambulatory Visit
Admission: RE | Admit: 2022-07-07 | Discharge: 2022-07-07 | Disposition: A | Discharge status: Home / Self Care | Payer: BC Managed Care – PPO | Source: Ambulatory Visit | Attending: Internal Medicine | Admitting: Internal Medicine

## 2022-07-07 DIAGNOSIS — Z0000 Encounter for general adult medical examination without abnormal findings: Secondary | ICD-10-CM

## 2022-07-11 ENCOUNTER — Other Ambulatory Visit: Payer: Self-pay | Admitting: Internal Medicine

## 2022-07-11 DIAGNOSIS — R928 Other abnormal and inconclusive findings on diagnostic imaging of breast: Secondary | ICD-10-CM

## 2022-07-18 ENCOUNTER — Ambulatory Visit
Admission: RE | Admit: 2022-07-18 | Discharge: 2022-07-18 | Disposition: A | Discharge status: Home / Self Care | Payer: BC Managed Care – PPO | Source: Ambulatory Visit | Attending: Internal Medicine | Admitting: Internal Medicine

## 2022-07-18 ENCOUNTER — Other Ambulatory Visit: Payer: Self-pay | Admitting: Internal Medicine

## 2022-07-18 DIAGNOSIS — R928 Other abnormal and inconclusive findings on diagnostic imaging of breast: Secondary | ICD-10-CM

## 2022-07-18 DIAGNOSIS — N6489 Other specified disorders of breast: Secondary | ICD-10-CM

## 2023-01-23 ENCOUNTER — Other Ambulatory Visit: Payer: Self-pay | Admitting: Internal Medicine

## 2023-01-23 DIAGNOSIS — Z1231 Encounter for screening mammogram for malignant neoplasm of breast: Secondary | ICD-10-CM

## 2023-01-25 ENCOUNTER — Inpatient Hospital Stay: Admission: RE | Admit: 2023-01-25 | Payer: BC Managed Care – PPO | Source: Ambulatory Visit

## 2023-03-13 NOTE — Progress Notes (Signed)
Anesthesia Review:  PCP: Cardiologist : Chest x-ray : PFT- 11/24/21  EKG : Echo : Stress test: Cardiac Cath :  Activity level:  Sleep Study/ CPAP : Fasting Blood Sugar :      / Checks Blood Sugar -- times a day:   Blood Thinner/ Instructions /Last Dose: ASA / Instructions/ Last Dose :

## 2023-03-14 NOTE — Patient Instructions (Signed)
SURGICAL WAITING ROOM VISITATION  Patients having surgery or a procedure may have no more than 2 support people in the waiting area - these visitors may rotate.    Children under the age of 61 must have an adult with them who is not the patient.  Due to an increase in RSV and influenza rates and associated hospitalizations, children ages 53 and under may not visit patients in [place].  If the patient needs to stay at the hospital during part of their recovery, the visitor guidelines for inpatient rooms apply. Pre-op nurse will coordinate an appropriate time for 1 support person to accompany patient in pre-op.  This support person may not rotate.    Please refer to the [facility] website for the visitor guidelines for Inpatients (after your surgery is over and you are in a regular room).       Your procedure is scheduled on:  03/20/2023    Report to [facility] Main Entrance    Report to admitting at  1200 noon    Call this number if you have problems the morning of surgery [phone]   Do not eat food :After Midnight.   After Midnight you may have the following liquids until __ 1115 ____ AM DAY OF SURGERY  Water Non-Citrus Juices (without pulp, NO RED-Apple, White grape, White cranberry) Black Coffee (NO MILK/CREAM OR CREAMERS, sugar ok)  Clear Tea (NO MILK/CREAM OR CREAMERS, sugar ok) regular and decaf                             Plain Jell-O (NO RED)                                           Fruit ices (not with fruit pulp, NO RED)                                     Popsicles (NO RED)                                                               Sports drinks like Gatorade (NO RED)                      The day of surgery:  Drink ONE (1) Pre-Surgery Clear Ensure or G2 at  1115  AM ( have completed by )  the morning of surgery. Drink in one sitting. Do not sip.  This drink was given to you during your hospital  pre-op appointment visit. Nothing else  to drink after completing the  Pre-Surgery Clear Ensure or G2.          If you have questions, please contact your surgeon's office.        Oral Hygiene is also important to reduce your risk of infection.                                    Remember - BRUSH YOUR  TEETH THE MORNING OF SURGERY WITH YOUR REGULAR TOOTHPASTE  DENTURES WILL BE REMOVED PRIOR TO SURGERY PLEASE DO NOT APPLY "Poly grip" OR ADHESIVES!!!   Do NOT smoke after Midnight   Take these medicines the morning of surgery with A SIP OF WATER:  Celexa, Toprol, Omeprazole   DO NOT TAKE ANY ORAL DIABETIC MEDICATIONS DAY OF YOUR SURGERY  Bring CPAP mask and tubing day of surgery.                              You may not have any metal on your body including hair pins, jewelry, and body piercing             Do not wear make-up, lotions, powders, perfumes/cologne, or deodorant  Do not wear nail polish including gel and S&S, artificial/acrylic nails, or any other type of covering on natural nails including finger and toenails. If you have artificial nails, gel coating, etc. that needs to be removed by a nail salon please have this removed prior to surgery or surgery may need to be canceled/ delayed if the surgeon/ anesthesia feels like they are unable to be safely monitored.   Do not shave  48 hours prior to surgery.               Men may shave face and neck.   Do not bring valuables to the hospital. [place].   Contacts, glasses, dentures or bridgework may not be worn into surgery.   Bring small overnight bag day of surgery.   DO NOT [place]. PHARMACY WILL DISPENSE MEDICATIONS LISTED ON YOUR MEDICATION LIST TO YOU DURING YOUR ADMISSION [place]!    Patients discharged on the day of surgery will not be allowed to drive home.  Someone NEEDS to stay with you for the first 24 hours after anesthesia.   Special Instructions: Bring a  copy of your healthcare power of attorney and living will documents the day of surgery if you haven't scanned them before.              Please read over the following fact sheets you were given: IF [place] [phone]   If you received a COVID test during your pre-op visit  it is requested that you wear a mask when out in public, stay away from anyone that may not be feeling well and notify your surgeon if you develop symptoms. If you test positive for Covid or have been in contact with anyone that has tested positive in the last 10 days please notify you surgeon.    [place] - Preparing for Surgery Before surgery, you can play an important role.  Because skin is not sterile, your skin needs to be as free of germs as possible.  You can reduce the number of germs on your skin by washing with CHG (chlorahexidine gluconate) soap before surgery.  CHG is an antiseptic cleaner which kills germs and bonds with the skin to continue killing germs even after washing. Please DO NOT use if you have an allergy to CHG or antibacterial soaps.  If your skin becomes reddened/irritated stop using the CHG and inform your nurse when you arrive at Short Stay. Do not shave (including legs and underarms) for at least  48 hours prior to the first CHG shower.  You may shave your face/neck. Please follow these instructions carefully:  1.  Shower with CHG Soap the night before surgery and the  morning of Surgery.  2.  If you choose to wash your hair, wash your hair first as usual with your  normal  shampoo.  3.  After you shampoo, rinse your hair and body thoroughly to remove the  shampoo.                           4.  Use CHG as you would any other liquid soap.  You can apply chg directly  to the skin and wash                       Gently with a scrungie or clean washcloth.  5.  Apply the CHG Soap to your body ONLY FROM THE NECK DOWN.   Do not use on face/ open                            Wound or open sores. Avoid contact with eyes, ears mouth and genitals (private parts).                       Wash face,  Genitals (private parts) with your normal soap.             6.  Wash thoroughly, paying special attention to the area where your surgery  will be performed.  7.  Thoroughly rinse your body with warm water from the neck down.  8.  DO NOT shower/wash with your normal soap after using and rinsing off  the CHG Soap.                9.  Pat yourself dry with a clean towel.            10.  Wear clean pajamas.            11.  Place clean sheets on your bed the night of your first shower and do not  sleep with pets. Day of Surgery : Do not apply any lotions/deodorants the morning of surgery.  Please wear clean clothes to the hospital/surgery center.  FAILURE TO FOLLOW THESE INSTRUCTIONS MAY RESULT IN THE CANCELLATION OF YOUR SURGERY PATIENT SIGNATURE_________________________________  NURSE SIGNATURE__________________________________  ________________________________________________________________________

## 2023-03-15 ENCOUNTER — Encounter (HOSPITAL_COMMUNITY): Payer: Self-pay

## 2023-03-15 ENCOUNTER — Encounter (HOSPITAL_COMMUNITY)
Admission: RE | Admit: 2023-03-15 | Discharge: 2023-03-15 | Disposition: A | Discharge status: Home / Self Care | Payer: BC Managed Care – PPO | Source: Ambulatory Visit | Attending: Orthopedic Surgery | Admitting: Orthopedic Surgery

## 2023-03-15 ENCOUNTER — Other Ambulatory Visit: Payer: Self-pay

## 2023-03-15 VITALS — BP 142/80 | HR 50 | Temp 97.8°F | Resp 16 | Ht 66.0 in | Wt 213.0 lb

## 2023-03-15 DIAGNOSIS — R001 Bradycardia, unspecified: Secondary | ICD-10-CM | POA: Diagnosis not present

## 2023-03-15 DIAGNOSIS — Z01818 Encounter for other preprocedural examination: Secondary | ICD-10-CM | POA: Diagnosis present

## 2023-03-15 HISTORY — DX: Unspecified osteoarthritis, unspecified site: M19.90

## 2023-03-15 HISTORY — DX: Anxiety disorder, unspecified: F41.9

## 2023-03-15 HISTORY — DX: Dyspnea, unspecified: R06.00

## 2023-03-15 HISTORY — DX: Pneumonia, unspecified organism: J18.9

## 2023-03-15 HISTORY — DX: Malignant (primary) neoplasm, unspecified: C80.1

## 2023-03-15 HISTORY — DX: Sleep apnea, unspecified: G47.30

## 2023-03-15 LAB — BASIC METABOLIC PANEL
Anion gap: 10 (ref 5–15)
BUN: 24 mg/dL — ABNORMAL HIGH (ref 6–20)
CO2: 24 mmol/L (ref 22–32)
Calcium: 9.2 mg/dL (ref 8.9–10.3)
Chloride: 101 mmol/L (ref 98–111)
Creatinine, Ser: 0.69 mg/dL (ref 0.44–1.00)
GFR, Estimated: >60 mL/min (ref >60)
Glucose, Bld: 124 mg/dL — ABNORMAL HIGH (ref 70–99)
Potassium: 4.1 mmol/L (ref 3.5–5.1)
Sodium: 135 mmol/L (ref 135–145)

## 2023-03-15 LAB — CBC
HCT: 43.7 % (ref 36.0–46.0)
Hemoglobin: 14.2 g/dL (ref 12.0–15.0)
MCH: 28.7 pg (ref 26.0–34.0)
MCHC: 32.5 g/dL (ref 30.0–36.0)
MCV: 88.5 fL (ref 80.0–100.0)
Platelets: 322 10*3/uL (ref 150–400)
RBC: 4.94 MIL/uL (ref 3.87–5.11)
RDW: 14.3 % (ref 11.5–15.5)
WBC: 9.7 10*3/uL (ref 4.0–10.5)
nRBC: 0 % (ref 0.0–0.2)

## 2023-03-15 LAB — SURGICAL PCR SCREEN
MRSA, PCR: NEGATIVE
Staphylococcus aureus: NEGATIVE
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
## 2023-03-19 NOTE — Anesthesia Preprocedure Evaluation (Signed)
Anesthesia Evaluation  Patient identified by MRN, date of birth, ID band Patient awake    Reviewed: Allergy & Precautions, NPO status , Patient's Chart, lab work & pertinent test results  History of Anesthesia Complications Negative for: history of anesthetic complications  Airway Mallampati: III  TM Distance: >3 FB Neck ROM: Full    Dental  (+) Dental Advisory Given   Pulmonary neg shortness of breath, sleep apnea , pneumonia (3 months ago), resolved, neg COPD, neg recent URI, Current Smoker   Pulmonary exam normal breath sounds clear to auscultation       Cardiovascular hypertension (amlodipine-benazepril, HCTZ, metoprolol), Pt. on medications and Pt. on home beta blockers (-) angina (-) Past MI, (-) Cardiac Stents and (-) CABG (-) dysrhythmias  Rhythm:Regular Rate:Normal     Neuro/Psych  PSYCHIATRIC DISORDERS Anxiety     negative neurological ROS     GI/Hepatic Neg liver ROS,GERD  Medicated,,  Endo/Other  neg diabetes  Thyroid adenoma  Renal/GU negative Renal ROS     Musculoskeletal  (+) Arthritis ,    Abdominal  (+) + obese  Peds  Hematology negative hematology ROS (+)   Anesthesia Other Findings   Reproductive/Obstetrics                             Anesthesia Physical Anesthesia Plan  ASA: 3  Anesthesia Plan: MAC, Regional and Spinal   Post-op Pain Management: Regional block* and Tylenol PO (pre-op)*   Induction: Intravenous  PONV Risk Score and Plan: 1 and Ondansetron, Dexamethasone and Treatment may vary due to age or medical condition  Airway Management Planned: Natural Airway and Simple Face Mask  Additional Equipment:   Intra-op Plan:   Post-operative Plan:   Informed Consent: I have reviewed the patients History and Physical, chart, labs and discussed the procedure including the risks, benefits and alternatives for the proposed anesthesia with the patient or  authorized representative who has indicated his/her understanding and acceptance.     Dental advisory given  Plan Discussed with: CRNA and Anesthesiologist  Anesthesia Plan Comments: (Discussed potential risks of nerve blocks including, but not limited to, infection, bleeding, nerve damage, seizures, pneumothorax, respiratory depression, and potential failure of the block. Alternatives to nerve blocks discussed. All questions answered.  I have discussed risks of neuraxial anesthesia including but not limited to infection, bleeding, nerve injury, back pain, headache, seizures, and failure of block. Patient denies bleeding disorders and is not currently anticoagulated. Labs have been reviewed. Risks and benefits discussed. All patient's questions answered.   Discussed with patient risks of MAC including, but not limited to, minor pain or discomfort, hearing people in the room, and possible need for backup general anesthesia. Risks for general anesthesia also discussed including, but not limited to, sore throat, hoarse voice, chipped/damaged teeth, injury to vocal cords, nausea and vomiting, allergic reactions, lung infection, heart attack, stroke, and death. All questions answered. )       Anesthesia Quick Evaluation

## 2023-03-20 ENCOUNTER — Ambulatory Visit (HOSPITAL_COMMUNITY): Payer: BC Managed Care – PPO | Admitting: Anesthesiology

## 2023-03-20 ENCOUNTER — Encounter (HOSPITAL_COMMUNITY): Payer: Self-pay | Admitting: Orthopedic Surgery

## 2023-03-20 ENCOUNTER — Other Ambulatory Visit: Payer: Self-pay

## 2023-03-20 ENCOUNTER — Observation Stay (HOSPITAL_COMMUNITY)
Admission: AD | Admit: 2023-03-20 | Discharge: 2023-03-22 | Disposition: A | Discharge status: Home / Self Care | Payer: BC Managed Care – PPO | Attending: Orthopedic Surgery | Admitting: Orthopedic Surgery

## 2023-03-20 ENCOUNTER — Ambulatory Visit (HOSPITAL_COMMUNITY): Payer: BC Managed Care – PPO | Admitting: Emergency Medicine

## 2023-03-20 ENCOUNTER — Encounter (HOSPITAL_COMMUNITY)
Admission: AD | Disposition: A | Discharge status: Home / Self Care | Payer: Self-pay | Source: Home / Self Care | Attending: Orthopedic Surgery

## 2023-03-20 DIAGNOSIS — Z8249 Family history of ischemic heart disease and other diseases of the circulatory system: Secondary | ICD-10-CM

## 2023-03-20 DIAGNOSIS — E890 Postprocedural hypothyroidism: Secondary | ICD-10-CM | POA: Diagnosis present

## 2023-03-20 DIAGNOSIS — Z79899 Other long term (current) drug therapy: Secondary | ICD-10-CM | POA: Insufficient documentation

## 2023-03-20 DIAGNOSIS — M1711 Unilateral primary osteoarthritis, right knee: Principal | ICD-10-CM | POA: Insufficient documentation

## 2023-03-20 DIAGNOSIS — Z96651 Presence of right artificial knee joint: Principal | ICD-10-CM

## 2023-03-20 DIAGNOSIS — Z833 Family history of diabetes mellitus: Secondary | ICD-10-CM

## 2023-03-20 DIAGNOSIS — Z91030 Bee allergy status: Secondary | ICD-10-CM

## 2023-03-20 DIAGNOSIS — F419 Anxiety disorder, unspecified: Secondary | ICD-10-CM | POA: Diagnosis present

## 2023-03-20 DIAGNOSIS — I10 Essential (primary) hypertension: Secondary | ICD-10-CM | POA: Insufficient documentation

## 2023-03-20 DIAGNOSIS — M65861 Other synovitis and tenosynovitis, right lower leg: Secondary | ICD-10-CM | POA: Diagnosis present

## 2023-03-20 DIAGNOSIS — Z6834 Body mass index (BMI) 34.0-34.9, adult: Secondary | ICD-10-CM

## 2023-03-20 DIAGNOSIS — I2510 Atherosclerotic heart disease of native coronary artery without angina pectoris: Secondary | ICD-10-CM | POA: Diagnosis present

## 2023-03-20 DIAGNOSIS — F17210 Nicotine dependence, cigarettes, uncomplicated: Secondary | ICD-10-CM | POA: Insufficient documentation

## 2023-03-20 DIAGNOSIS — Z96659 Presence of unspecified artificial knee joint: Secondary | ICD-10-CM | POA: Diagnosis not present

## 2023-03-20 DIAGNOSIS — Z01818 Encounter for other preprocedural examination: Secondary | ICD-10-CM

## 2023-03-20 HISTORY — PX: TOTAL KNEE ARTHROPLASTY: SHX125

## 2023-03-20 SURGERY — ARTHROPLASTY, KNEE, TOTAL
Anesthesia: Monitor Anesthesia Care | Site: Knee | Laterality: Right

## 2023-03-20 MED ORDER — ONDANSETRON HCL 4 MG PO TABS: 4.0000 mg | ORAL_TABLET | Freq: Four times a day (QID) | ORAL | Status: DC | PRN

## 2023-03-20 MED ORDER — SODIUM CHLORIDE (PF) 0.9 % IJ SOLN
INTRAMUSCULAR | Status: DC | PRN
  Administered 2023-03-20: 30 mL

## 2023-03-20 MED ORDER — OXYCODONE HCL 5 MG PO TABS
10.0000 mg | ORAL_TABLET | ORAL | Status: DC | PRN
  Administered 2023-03-20 – 2023-03-22 (×4): 10 mg via ORAL
  Filled 2023-03-20 (×2): qty 2

## 2023-03-20 MED ORDER — MIDAZOLAM HCL 2 MG/2ML IJ SOLN
INTRAMUSCULAR | Status: AC
  Filled 2023-03-20: qty 2

## 2023-03-20 MED ORDER — EPINEPHRINE PF 1 MG/ML IJ SOLN
INTRAMUSCULAR | Status: AC
  Filled 2023-03-20: qty 3

## 2023-03-20 MED ORDER — FENTANYL CITRATE (PF) 100 MCG/2ML IJ SOLN
INTRAMUSCULAR | Status: DC | PRN
  Administered 2023-03-20: 50 ug via INTRAVENOUS

## 2023-03-20 MED ORDER — BUPIVACAINE-EPINEPHRINE (PF) 0.25% -1:200000 IJ SOLN
INTRAMUSCULAR | Status: DC | PRN
  Administered 2023-03-20: 30 mL

## 2023-03-20 MED ORDER — PROPOFOL 1000 MG/100ML IV EMUL
INTRAVENOUS | Status: AC
  Filled 2023-03-20: qty 100

## 2023-03-20 MED ORDER — EPHEDRINE SULFATE-NACL 50-0.9 MG/10ML-% IV SOSY
PREFILLED_SYRINGE | INTRAVENOUS | Status: DC | PRN
  Administered 2023-03-20 (×2): 5 mg via INTRAVENOUS

## 2023-03-20 MED ORDER — PANTOPRAZOLE SODIUM 40 MG PO TBEC
40.0000 mg | DELAYED_RELEASE_TABLET | Freq: Every day | ORAL | Status: DC
  Administered 2023-03-20 – 2023-03-22 (×3): 40 mg via ORAL
  Filled 2023-03-20 (×3): qty 1

## 2023-03-20 MED ORDER — CHLORHEXIDINE GLUCONATE 0.12 % MT SOLN
15.0000 mL | Freq: Once | OROMUCOSAL | Status: AC
  Administered 2023-03-20: 15 mL via OROMUCOSAL

## 2023-03-20 MED ORDER — BENAZEPRIL HCL 20 MG PO TABS
40.0000 mg | ORAL_TABLET | Freq: Every day | ORAL | Status: DC
  Filled 2023-03-20: qty 2

## 2023-03-20 MED ORDER — CEFAZOLIN SODIUM-DEXTROSE 2-4 GM/100ML-% IV SOLN
2.0000 g | Freq: Four times a day (QID) | INTRAVENOUS | Status: AC
  Administered 2023-03-20 (×2): 2 g via INTRAVENOUS
  Filled 2023-03-20 (×2): qty 100

## 2023-03-20 MED ORDER — DEXAMETHASONE SODIUM PHOSPHATE 10 MG/ML IJ SOLN: 10.0000 mg | Freq: Once | INTRAMUSCULAR | Status: DC

## 2023-03-20 MED ORDER — TRANEXAMIC ACID-NACL 1000-0.7 MG/100ML-% IV SOLN
1000.0000 mg | INTRAVENOUS | Status: AC
  Administered 2023-03-20: 1000 mg via INTRAVENOUS
  Filled 2023-03-20: qty 100

## 2023-03-20 MED ORDER — LACTATED RINGERS IV SOLN: INTRAVENOUS | Status: DC

## 2023-03-20 MED ORDER — EPHEDRINE 5 MG/ML INJ
INTRAVENOUS | Status: AC
  Filled 2023-03-20: qty 5

## 2023-03-20 MED ORDER — FENTANYL CITRATE (PF) 100 MCG/2ML IJ SOLN
INTRAMUSCULAR | Status: AC
  Filled 2023-03-20: qty 2

## 2023-03-20 MED ORDER — DIPHENHYDRAMINE HCL 12.5 MG/5ML PO ELIX: 12.5000 mg | ORAL_SOLUTION | ORAL | Status: DC | PRN

## 2023-03-20 MED ORDER — KETOROLAC TROMETHAMINE 30 MG/ML IJ SOLN
INTRAMUSCULAR | Status: DC | PRN
  Administered 2023-03-20: 30 mg

## 2023-03-20 MED ORDER — MIDAZOLAM HCL 5 MG/5ML IJ SOLN
INTRAMUSCULAR | Status: DC | PRN
  Administered 2023-03-20: 2 mg via INTRAVENOUS

## 2023-03-20 MED ORDER — HYDROCHLOROTHIAZIDE 12.5 MG PO TABS
12.5000 mg | ORAL_TABLET | Freq: Every day | ORAL | Status: DC
  Administered 2023-03-21: 12.5 mg via ORAL
  Filled 2023-03-20 (×2): qty 1

## 2023-03-20 MED ORDER — ONDANSETRON HCL 4 MG/2ML IJ SOLN: 4.0000 mg | Freq: Four times a day (QID) | INTRAMUSCULAR | Status: DC | PRN

## 2023-03-20 MED ORDER — DEXAMETHASONE SODIUM PHOSPHATE 10 MG/ML IJ SOLN
INTRAMUSCULAR | Status: AC
  Filled 2023-03-20: qty 1

## 2023-03-20 MED ORDER — ONDANSETRON HCL 4 MG/2ML IJ SOLN
INTRAMUSCULAR | Status: AC
  Filled 2023-03-20: qty 2

## 2023-03-20 MED ORDER — AMISULPRIDE (ANTIEMETIC) 5 MG/2ML IV SOLN: 10.0000 mg | Freq: Once | INTRAVENOUS | Status: DC | PRN

## 2023-03-20 MED ORDER — BENAZEPRIL HCL 20 MG PO TABS
40.0000 mg | ORAL_TABLET | Freq: Every day | ORAL | Status: DC
  Administered 2023-03-21: 40 mg via ORAL
  Filled 2023-03-20 (×2): qty 2

## 2023-03-20 MED ORDER — ACETAMINOPHEN 325 MG PO TABS
325.0000 mg | ORAL_TABLET | Freq: Four times a day (QID) | ORAL | Status: DC | PRN
  Administered 2023-03-22: 650 mg via ORAL

## 2023-03-20 MED ORDER — HYDROCHLOROTHIAZIDE 12.5 MG PO TABS: 12.5000 mg | ORAL_TABLET | Freq: Every day | ORAL | Status: DC

## 2023-03-20 MED ORDER — PROPOFOL 500 MG/50ML IV EMUL
INTRAVENOUS | Status: DC | PRN
  Administered 2023-03-20: 100 ug/kg/min via INTRAVENOUS

## 2023-03-20 MED ORDER — OXYCODONE HCL 5 MG/5ML PO SOLN: 5.0000 mg | Freq: Once | ORAL | Status: DC | PRN

## 2023-03-20 MED ORDER — METOCLOPRAMIDE HCL 5 MG/ML IJ SOLN: 5.0000 mg | Freq: Three times a day (TID) | INTRAMUSCULAR | Status: DC | PRN

## 2023-03-20 MED ORDER — SODIUM CHLORIDE 0.9 % IR SOLN
Status: DC | PRN
  Administered 2023-03-20: 1000 mL

## 2023-03-20 MED ORDER — BUPIVACAINE IN DEXTROSE 0.75-8.25 % IT SOLN
INTRATHECAL | Status: DC | PRN
  Administered 2023-03-20: 1.6 mL via INTRATHECAL

## 2023-03-20 MED ORDER — OXYCODONE HCL 5 MG PO TABS
5.0000 mg | ORAL_TABLET | ORAL | Status: DC | PRN
  Administered 2023-03-20 – 2023-03-22 (×6): 10 mg via ORAL
  Filled 2023-03-20 (×7): qty 2

## 2023-03-20 MED ORDER — LIDOCAINE HCL (PF) 2 % IJ SOLN
INTRAMUSCULAR | Status: AC
  Filled 2023-03-20: qty 5

## 2023-03-20 MED ORDER — METHOCARBAMOL 500 MG IVPB - SIMPLE MED: 500.0000 mg | Freq: Four times a day (QID) | INTRAVENOUS | Status: DC | PRN

## 2023-03-20 MED ORDER — ONDANSETRON HCL 4 MG/2ML IJ SOLN
INTRAMUSCULAR | Status: DC | PRN
  Administered 2023-03-20: 4 mg via INTRAVENOUS

## 2023-03-20 MED ORDER — SODIUM CHLORIDE 0.9 % IV SOLN: INTRAVENOUS | Status: DC

## 2023-03-20 MED ORDER — BISACODYL 10 MG RE SUPP: 10.0000 mg | Freq: Every day | RECTAL | Status: DC | PRN

## 2023-03-20 MED ORDER — HYDROMORPHONE HCL 1 MG/ML IJ SOLN: 0.5000 mg | INTRAMUSCULAR | Status: DC | PRN

## 2023-03-20 MED ORDER — POVIDONE-IODINE 10 % EX SWAB: 2.0000 | Freq: Once | CUTANEOUS | Status: DC

## 2023-03-20 MED ORDER — PROPOFOL 10 MG/ML IV BOLUS
INTRAVENOUS | Status: DC | PRN
  Administered 2023-03-20 (×2): 20 mg via INTRAVENOUS

## 2023-03-20 MED ORDER — FENTANYL CITRATE PF 50 MCG/ML IJ SOSY
PREFILLED_SYRINGE | INTRAMUSCULAR | Status: AC
  Administered 2023-03-20: 50 ug via INTRAVENOUS
  Filled 2023-03-20: qty 3

## 2023-03-20 MED ORDER — KETOROLAC TROMETHAMINE 30 MG/ML IJ SOLN
INTRAMUSCULAR | Status: AC
  Filled 2023-03-20: qty 1

## 2023-03-20 MED ORDER — CITALOPRAM HYDROBROMIDE 20 MG PO TABS
40.0000 mg | ORAL_TABLET | Freq: Every day | ORAL | Status: DC
  Administered 2023-03-20 – 2023-03-21 (×2): 40 mg via ORAL
  Filled 2023-03-20 (×2): qty 2

## 2023-03-20 MED ORDER — ORAL CARE MOUTH RINSE: 15.0000 mL | Freq: Once | OROMUCOSAL | Status: AC

## 2023-03-20 MED ORDER — ACETAMINOPHEN 500 MG PO TABS
1000.0000 mg | ORAL_TABLET | Freq: Four times a day (QID) | ORAL | Status: AC
  Administered 2023-03-20 – 2023-03-21 (×3): 1000 mg via ORAL
  Filled 2023-03-20 (×3): qty 2

## 2023-03-20 MED ORDER — METOPROLOL SUCCINATE ER 50 MG PO TB24
50.0000 mg | ORAL_TABLET | Freq: Every day | ORAL | Status: DC
  Administered 2023-03-21: 50 mg via ORAL
  Filled 2023-03-20 (×2): qty 1

## 2023-03-20 MED ORDER — POLYETHYLENE GLYCOL 3350 17 G PO PACK
17.0000 g | PACK | Freq: Two times a day (BID) | ORAL | Status: DC
  Administered 2023-03-21 – 2023-03-22 (×3): 17 g via ORAL
  Filled 2023-03-20 (×4): qty 1

## 2023-03-20 MED ORDER — PHENOL 1.4 % MT LIQD: 1.0000 | OROMUCOSAL | Status: DC | PRN

## 2023-03-20 MED ORDER — METHOCARBAMOL 500 MG PO TABS
500.0000 mg | ORAL_TABLET | Freq: Four times a day (QID) | ORAL | Status: DC | PRN
  Administered 2023-03-20 – 2023-03-22 (×2): 500 mg via ORAL
  Filled 2023-03-20: qty 1

## 2023-03-20 MED ORDER — BUPIVACAINE HCL (PF) 0.25 % IJ SOLN
INTRAMUSCULAR | Status: AC
  Filled 2023-03-20: qty 30

## 2023-03-20 MED ORDER — TRANEXAMIC ACID-NACL 1000-0.7 MG/100ML-% IV SOLN
1000.0000 mg | Freq: Once | INTRAVENOUS | Status: AC
  Administered 2023-03-20: 1000 mg via INTRAVENOUS
  Filled 2023-03-20: qty 100

## 2023-03-20 MED ORDER — METOPROLOL SUCCINATE ER 50 MG PO TB24
50.0000 mg | ORAL_TABLET | Freq: Every day | ORAL | Status: DC
  Filled 2023-03-20: qty 1

## 2023-03-20 MED ORDER — AMLODIPINE BESYLATE 10 MG PO TABS
10.0000 mg | ORAL_TABLET | Freq: Every day | ORAL | Status: DC
  Administered 2023-03-20 – 2023-03-21 (×2): 10 mg via ORAL
  Filled 2023-03-20 (×2): qty 1

## 2023-03-20 MED ORDER — CEFAZOLIN SODIUM-DEXTROSE 2-4 GM/100ML-% IV SOLN
2.0000 g | INTRAVENOUS | Status: AC
  Administered 2023-03-20: 2 g via INTRAVENOUS
  Filled 2023-03-20: qty 100

## 2023-03-20 MED ORDER — ACETAMINOPHEN 500 MG PO TABS
1000.0000 mg | ORAL_TABLET | Freq: Once | ORAL | Status: AC
  Administered 2023-03-20: 1000 mg via ORAL
  Filled 2023-03-20: qty 2

## 2023-03-20 MED ORDER — SODIUM CHLORIDE (PF) 0.9 % IJ SOLN
INTRAMUSCULAR | Status: AC
  Filled 2023-03-20: qty 30

## 2023-03-20 MED ORDER — AMLODIPINE BESYLATE 10 MG PO TABS
10.0000 mg | ORAL_TABLET | Freq: Every day | ORAL | Status: DC
  Filled 2023-03-20: qty 1

## 2023-03-20 MED ORDER — METOCLOPRAMIDE HCL 5 MG PO TABS: 5.0000 mg | ORAL_TABLET | Freq: Three times a day (TID) | ORAL | Status: DC | PRN

## 2023-03-20 MED ORDER — MENTHOL 3 MG MT LOZG: 1.0000 | LOZENGE | OROMUCOSAL | Status: DC | PRN

## 2023-03-20 MED ORDER — FENTANYL CITRATE PF 50 MCG/ML IJ SOSY
25.0000 ug | PREFILLED_SYRINGE | INTRAMUSCULAR | Status: DC | PRN
  Administered 2023-03-20: 50 ug via INTRAVENOUS

## 2023-03-20 MED ORDER — ASPIRIN 81 MG PO CHEW
81.0000 mg | CHEWABLE_TABLET | Freq: Two times a day (BID) | ORAL | Status: DC
  Administered 2023-03-20 – 2023-03-22 (×4): 81 mg via ORAL
  Filled 2023-03-20 (×4): qty 1

## 2023-03-20 MED ORDER — CITALOPRAM HYDROBROMIDE 20 MG PO TABS
40.0000 mg | ORAL_TABLET | Freq: Every day | ORAL | Status: DC
  Filled 2023-03-20: qty 2

## 2023-03-20 MED ORDER — DEXAMETHASONE SODIUM PHOSPHATE 10 MG/ML IJ SOLN
8.0000 mg | Freq: Once | INTRAMUSCULAR | Status: AC
  Administered 2023-03-20: 8 mg via INTRAVENOUS

## 2023-03-20 MED ORDER — DOCUSATE SODIUM 100 MG PO CAPS
100.0000 mg | ORAL_CAPSULE | Freq: Two times a day (BID) | ORAL | Status: DC
  Administered 2023-03-20 – 2023-03-22 (×5): 100 mg via ORAL
  Filled 2023-03-20 (×5): qty 1

## 2023-03-20 MED ORDER — OXYCODONE HCL 5 MG PO TABS
ORAL_TABLET | ORAL | Status: AC
  Filled 2023-03-20: qty 2

## 2023-03-20 MED ORDER — OXYCODONE HCL 5 MG PO TABS: 5.0000 mg | ORAL_TABLET | Freq: Once | ORAL | Status: DC | PRN

## 2023-03-20 MED ORDER — 0.9 % SODIUM CHLORIDE (POUR BTL) OPTIME
TOPICAL | Status: DC | PRN
  Administered 2023-03-20: 1000 mL

## 2023-03-20 MED ORDER — ROPIVACAINE HCL 5 MG/ML IJ SOLN
INTRAMUSCULAR | Status: DC | PRN
  Administered 2023-03-20: 20 mL via PERINEURAL

## 2023-03-20 SURGICAL SUPPLY — 65 items
ADH SKN CLS APL DERMABOND .7 (GAUZE/BANDAGES/DRESSINGS) ×1
ADH SKNCLS APL OCTYL .7 VIOL (GAUZE/BANDAGES/DRESSINGS) ×1
ATTUNE MED ANAT PAT 38 KNEE (Knees) IMPLANT
BAG COUNTER SPONGE SURGICOUNT (BAG) IMPLANT
BAG SPEC THK2 15X12 ZIP CLS (MISCELLANEOUS) ×1
BAG SPNG CNTER NS LX DISP (BAG) ×1
BAG ZIPLOCK 12X15 (MISCELLANEOUS) IMPLANT
BASEPLATE TIB CMT FB PCKT SZ4 (Stem) IMPLANT
BLADE SAW SGTL 11.0X1.19X90.0M (BLADE) IMPLANT
BLADE SAW SGTL 13.0X1.19X90.0M (BLADE) ×1 IMPLANT
BNDG CMPR 5X62 HK CLSR LF (GAUZE/BANDAGES/DRESSINGS) ×1
BNDG CMPR 6 X 5 YARDS HK CLSR (GAUZE/BANDAGES/DRESSINGS) ×1
BNDG CMPR MED 10X6 ELC LF (GAUZE/BANDAGES/DRESSINGS) ×1
BNDG ELASTIC 6INX 5YD STR LF (GAUZE/BANDAGES/DRESSINGS) ×1 IMPLANT
BNDG ELASTIC 6X10 VLCR STRL LF (GAUZE/BANDAGES/DRESSINGS) IMPLANT
BOWL SMART MIX CTS (DISPOSABLE) ×1 IMPLANT
BSPLAT TIB 4 CMNT FX BRNG STRL (Stem) ×1 IMPLANT
CEMENT HV SMART SET (Cement) IMPLANT
COMP FEM CMT ATTUNE NRW 5 RT (Joint) ×1 IMPLANT
COMPONENT FEM CMT ATTN NRW 5RT (Joint) IMPLANT
COOLER ICEMAN CLASSIC (MISCELLANEOUS) IMPLANT
COVER SURGICAL LIGHT HANDLE (MISCELLANEOUS) ×1 IMPLANT
CUFF TOURN SGL QUICK 34 (TOURNIQUET CUFF) ×1
CUFF TRNQT CYL 34X4.125X (TOURNIQUET CUFF) ×1 IMPLANT
DERMABOND ADVANCED .7 DNX12 (GAUZE/BANDAGES/DRESSINGS) ×1 IMPLANT
DRAPE U-SHAPE 47X51 STRL (DRAPES) ×1 IMPLANT
DRESSING AQUACEL AG SP 3.5X10 (GAUZE/BANDAGES/DRESSINGS) ×1 IMPLANT
DRSG AQUACEL AG ADV 3.5X10 (GAUZE/BANDAGES/DRESSINGS) IMPLANT
DRSG AQUACEL AG SP 3.5X10 (GAUZE/BANDAGES/DRESSINGS) ×1
DURAPREP 26ML APPLICATOR (WOUND CARE) ×2 IMPLANT
ELECT REM PT RETURN 15FT ADLT (MISCELLANEOUS) ×1 IMPLANT
GLOVE BIO SURGEON STRL SZ 6 (GLOVE) ×1 IMPLANT
GLOVE BIOGEL PI IND STRL 6.5 (GLOVE) ×1 IMPLANT
GLOVE BIOGEL PI IND STRL 7.5 (GLOVE) ×1 IMPLANT
GLOVE ORTHO TXT STRL SZ7.5 (GLOVE) ×2 IMPLANT
GOWN STRL REUS W/ TWL LRG LVL3 (GOWN DISPOSABLE) ×2 IMPLANT
GOWN STRL REUS W/TWL LRG LVL3 (GOWN DISPOSABLE) ×2
HANDPIECE INTERPULSE COAX TIP (DISPOSABLE) ×1
HOLDER FOLEY CATH W/STRAP (MISCELLANEOUS) IMPLANT
IMMOBILIZER KNEE 20 (SOFTGOODS) ×1
IMMOBILIZER KNEE 20 THIGH 36 (SOFTGOODS) IMPLANT
INSERT TIB ATTUNE KNEE 5 8 RT (Insert) IMPLANT
KIT TURNOVER KIT A (KITS) IMPLANT
MANIFOLD NEPTUNE II (INSTRUMENTS) ×1 IMPLANT
NDL SAFETY ECLIP 18X1.5 (MISCELLANEOUS) IMPLANT
NS IRRIG 1000ML POUR BTL (IV SOLUTION) ×1 IMPLANT
PACK TOTAL KNEE CUSTOM (KITS) ×1 IMPLANT
PAD COLD SHLDR WRAP-ON (PAD) IMPLANT
PIN FIX SIGMA LCS THRD HI (PIN) IMPLANT
PROTECTOR NERVE ULNAR (MISCELLANEOUS) ×1 IMPLANT
SET HNDPC FAN SPRY TIP SCT (DISPOSABLE) ×1 IMPLANT
SET PAD KNEE POSITIONER (MISCELLANEOUS) ×1 IMPLANT
SPIKE FLUID TRANSFER (MISCELLANEOUS) ×2 IMPLANT
SUT MNCRL AB 4-0 PS2 18 (SUTURE) ×1 IMPLANT
SUT STRATAFIX PDS+ 0 24IN (SUTURE) ×1 IMPLANT
SUT VIC AB 1 CT1 36 (SUTURE) ×1 IMPLANT
SUT VIC AB 2-0 CT1 27 (SUTURE) ×2
SUT VIC AB 2-0 CT1 TAPERPNT 27 (SUTURE) ×2 IMPLANT
SYR 3ML LL SCALE MARK (SYRINGE) ×1 IMPLANT
TOWEL GREEN STERILE FF (TOWEL DISPOSABLE) ×1 IMPLANT
TRAY FOLEY MTR SLVR 14FR STAT (SET/KITS/TRAYS/PACK) IMPLANT
TRAY FOLEY MTR SLVR 16FR STAT (SET/KITS/TRAYS/PACK) ×1 IMPLANT
TUBE SUCTION HIGH CAP CLEAR NV (SUCTIONS) ×1 IMPLANT
WATER STERILE IRR 1000ML POUR (IV SOLUTION) ×2 IMPLANT
WRAP KNEE MAXI GEL POST OP (GAUZE/BANDAGES/DRESSINGS) ×1 IMPLANT

## 2023-03-20 NOTE — Anesthesia Postprocedure Evaluation (Signed)
Anesthesia Post Note  Patient: Paula Brown  Procedure(s) Performed: TOTAL KNEE ARTHROPLASTY (Right: Knee)     Patient location during evaluation: PACU Anesthesia Type: Regional, Spinal and MAC Level of consciousness: awake Pain management: pain level controlled Vital Signs Assessment: post-procedure vital signs reviewed and stable Respiratory status: spontaneous breathing, respiratory function stable and nonlabored ventilation Cardiovascular status: blood pressure returned to baseline and stable Postop Assessment: no headache, no backache and no apparent nausea or vomiting Anesthetic complications: no   No notable events documented.  Last Vitals:  Vitals:   03/20/23 0930 03/20/23 0945  BP: 101/70 111/68  Pulse: (!) 49 (!) 49  Resp: 12 10  Temp:    SpO2: 93% 94%    Last Pain:  Vitals:   03/20/23 0945  TempSrc:   PainSc: Asleep                 Nilda Simmer

## 2023-03-20 NOTE — Transfer of Care (Signed)
Immediate Anesthesia Transfer of Care Note  Patient: Paula Brown  Procedure(s) Performed: TOTAL KNEE ARTHROPLASTY (Right: Knee)  Patient Location: PACU  Anesthesia Type:Regional and Spinal  Level of Consciousness: awake, alert , and oriented  Airway & Oxygen Therapy: Patient Spontanous Breathing and Patient connected to face mask oxygen  Post-op Assessment: Report given to RN and Post -op Vital signs reviewed and stable  Post vital signs: Reviewed and stable  Last Vitals:  Vitals Value Taken Time  BP    Temp    Pulse 54 03/20/23 0914  Resp 11 03/20/23 0914  SpO2 98 % 03/20/23 0914  Vitals shown include unvalidated device data.  Last Pain:  Vitals:   03/20/23 0601  TempSrc: Oral         Complications: No notable events documented.

## 2023-03-20 NOTE — Discharge Instructions (Signed)

## 2023-03-20 NOTE — Interval H&P Note (Signed)
History and Physical Interval Note:  03/20/2023 7:08 AM  Paula Brown  has presented today for surgery, with the diagnosis of Right knee osteoarthritis.  The various methods of treatment have been discussed with the patient and family. After consideration of risks, benefits and other options for treatment, the patient has consented to  Procedure(s): TOTAL KNEE ARTHROPLASTY (Right) as a surgical intervention.  The patient's history has been reviewed, patient examined, no change in status, stable for surgery.  I have reviewed the patient's chart and labs.  Questions were answered to the patient's satisfaction.     Mauri Pole

## 2023-03-20 NOTE — Op Note (Signed)
NAME:  Paula Brown                      MEDICAL RECORD NO.:  [id number]                             FACILITY:  [facility]      PHYSICIAN:  Pietro Cassis. Alvan Dame, M.D.  DATE OF BIRTH:  Apr 28, 1966      DATE OF PROCEDURE:  03/20/2023                                     OPERATIVE REPORT         PREOPERATIVE DIAGNOSIS:  Right knee osteoarthritis.      POSTOPERATIVE DIAGNOSIS:  Right knee osteoarthritis.      FINDINGS:  The patient was noted to have complete loss of cartilage and   bone-on-bone arthritis with associated osteophytes in the medial and patellofemoral compartments of   the knee with a significant synovitis and associated effusion.  The patient had failed months of conservative treatment including medications, injection therapy, activity modification.     PROCEDURE:  Right total knee replacement.      COMPONENTS USED:  DePuy Attune FB CR MS knee   system, a size 5N femur, 4 tibia, size 8 mm CR MS AOX insert, and 38 anatomic patellar   button.      SURGEON:  Pietro Cassis. Alvan Dame, M.D.      ASSISTANT:  Costella Hatcher, PA-C.      ANESTHESIA:  Regional and Spinal.      SPECIMENS:  None.      COMPLICATION:  None.      DRAINS:  None.  EBL: <100 cc      TOURNIQUET TIME:   Total Tourniquet Time Documented: Thigh (Right) - 31 minutes Total: Thigh (Right) - 31 minutes  .      The patient was stable to the recovery room.      INDICATION FOR PROCEDURE:  Paula Brown is a 57 y.o. female patient of   mine.  The patient had been seen, evaluated, and treated for months conservatively in the   office with medication, activity modification, and injections.  The patient had   radiographic changes of bone-on-bone arthritis with endplate sclerosis and osteophytes noted.  Based on the radiographic changes and failed conservative measures, the patient   decided to proceed with definitive treatment, total knee replacement.  Risks of infection, DVT, component failure, need for revision surgery,  neurovascular injury were reviewed in the office setting.  The postop course was reviewed stressing the efforts to maximize post-operative satisfaction and function.  Consent was obtained for benefit of pain   relief.      PROCEDURE IN DETAIL:  The patient was brought to the operative theater.   Once adequate anesthesia, preoperative antibiotics, 2 gm of Ancef,1 gm of Tranexamic Acid, and 10 mg of Decadron administered, the patient was positioned supine with a right thigh tourniquet placed.  The  right lower extremity was prepped and draped in sterile fashion.  A time-   out was performed identifying the patient, planned procedure, and the appropriate extremity.      The right lower extremity was placed in the [facility] leg holder.  The leg was   exsanguinated, tourniquet elevated to 225 mmHg.  A midline incision was   made followed  by median parapatellar arthrotomy.  Following initial   exposure, attention was first directed to the patella.  Precut   measurement was noted to be 22 mm.  I resected down to 13 mm and used a   38 anatomic patellar button to restore patellar height as well as cover the cut surface.      The lug holes were drilled and a metal shim was placed to protect the   patella from retractors and saw blade during the procedure.      At this point, attention was now directed to the femur.  The femoral   canal was opened with a drill, irrigated to try to prevent fat emboli.  An   intramedullary rod was passed at 3 degrees valgus, 9 mm of bone was   resected off the distal femur.  Following this resection, the tibia was   subluxated anteriorly.  Using the extramedullary guide, 2 mm of bone was resected off   the proximal medial tibia.  We confirmed the gap would be   stable medially and laterally with a size 6 spacer block as well as confirmed that the tibial cut was perpendicular in the coronal plane, checking with an alignment rod.      Once this was done, I sized the femur to  be a size 5 in the anterior-   posterior dimension, chose a narrow component based on medial and   lateral dimension.  The size 5 rotation block was then pinned in   position anterior referenced using the C-clamp to set rotation.  The   anterior, posterior, and  chamfer cuts were made without difficulty nor   notching making certain that I was along the anterior cortex to help   with flexion gap stability.      The final shim cut was made off the lateral aspect of distal femur.      At this point, the tibia was sized to be a size 4.  The size 4 tray was   then pinned in position after floating a trial tibial tray to set rotation then   drilled, and keel punched.  Trial reduction was now carried with a 5 femur,  4 tibia, a size 8 mm CR MS insert, and the 38 anatomic patella botton.  The knee was brought to full extension with good flexion stability with the patella   tracking through the trochlea without application of pressure.  Given   all these findings the trial components removed.  Final components were   opened and cement was mixed.  The knee was irrigated with normal saline solution and pulse lavage.  The synovial lining was   then injected with 30 cc of 0.25% Marcaine with epinephrine, 1 cc of Toradol and 30 cc of NS for a total of 61 cc.     Final implants were then cemented onto cleaned and dried cut surfaces of bone with the knee brought to extension with a size 8 mm CR MS trial insert.      Once the cement had fully cured, excess cement was removed   throughout the knee.  I confirmed that I was satisfied with the range of   motion and stability, and the final size 8 mm CR MS AOX insert was chosen.  It was   placed into the knee.      The tourniquet had been let down at 31 minutes.  No significant   hemostasis was required.  The extensor mechanism was then  reapproximated using #1 Vicryl and #1 Stratafix sutures with the knee   in flexion.  The   remaining wound was closed with  2-0 Vicryl and running 4-0 Monocryl.   The knee was cleaned, dried, dressed sterilely using Dermabond and   Aquacel dressing.  The patient was then   brought to recovery room in stable condition, tolerating the procedure   well.   Please note that Physician Assistant, Costella Hatcher, PA-C was present for the entirety of the case, and was utilized for pre-operative positioning, peri-operative retractor management, general facilitation of the procedure and for primary wound closure at the end of the case.              Pietro Cassis Alvan Dame, M.D.    03/20/2023 8:48 AM

## 2023-03-20 NOTE — Anesthesia Procedure Notes (Signed)
Spinal  Patient location during procedure: OR Start time: 03/20/2023 7:30 AM End time: 03/20/2023 7:36 AM Reason for block: surgical anesthesia Staffing Performed: anesthesiologist and resident/CRNA  Anesthesiologist: Nilda Simmer, MD Resident/CRNA: Marijo Conception, CRNA Performed by: Nilda Simmer, MD Authorized by: Nilda Simmer, MD   Preanesthetic Checklist Completed: patient identified, IV checked, site marked, risks and benefits discussed, surgical consent, monitors and equipment checked, pre-op evaluation and timeout performed Spinal Block Patient position: sitting Prep: DuraPrep Patient monitoring: heart rate, cardiac monitor, continuous pulse ox and blood pressure Approach: midline Location: L3-4 Injection technique: single-shot Needle Needle type: Sprotte  Needle gauge: 24 G Needle length: 9 cm Assessment Sensory level: T4 Events: CSF return

## 2023-03-20 NOTE — Evaluation (Signed)
Physical Therapy Evaluation Patient Details Name: Paula Brown MRN: [id number] DOB: 11-Sep-1966 Today's Date: 03/20/2023  History of Present Illness  57 yo female presents to therapy s/p R TKA on 03/20/2023 due to failure of conservative measures. Pt PMH includes but is not limited to: thyroid adenoma, OSA on CPAP, L TKA, and HTN.  Clinical Impression  Paula Brown is a 57 y.o. female POD 0 s/p R TKA. Patient reports IND with mobility at baseline. Patient is now limited by functional impairments (see PT problem list below) and requires min guard and HOB elevated for bed mobility and min guard and cues for transfers. Patient was able to ambulate 26 feet with RW and min guard level of assist. Patient instructed in exercise to facilitate ROM and circulation to manage edema. Pt reports 8/10 pain with hx of chronic pain attributed to L TKA ~ 5 years ago.  Patient will benefit from continued skilled PT interventions to address impairments and progress towards PLOF. Acute PT will follow to progress mobility and stair training in preparation for safe discharge home and reports will transition to OPPT.        Recommendations for follow up therapy are one component of a multi-disciplinary discharge planning process, led by the attending physician.  Recommendations may be updated based on patient status, additional functional criteria and insurance authorization.  Follow Up Recommendations       Assistance Recommended at Discharge Intermittent Supervision/Assistance  Patient can return home with the following  A little help with walking and/or transfers;A little help with bathing/dressing/bathroom;Assistance with cooking/housework;Assist for transportation;Help with stairs or ramp for entrance    Equipment Recommendations Rolling walker (2 wheels);BSC/3in1 (pt aware of potenital cost associated with BSC/3in1)  Recommendations for Other Services       Functional Status Assessment Patient has had a  recent decline in their functional status and demonstrates the ability to make significant improvements in function in a reasonable and predictable amount of time.     Precautions / Restrictions Precautions Precautions: Knee;Fall Restrictions Weight Bearing Restrictions: No      Mobility  Bed Mobility Overal bed mobility: Needs Assistance Bed Mobility: Supine to Sit     Supine to sit: Min guard, HOB elevated (increased time)          Transfers Overall transfer level: Needs assistance Equipment used: Rolling walker (2 wheels) Transfers: Sit to/from Stand Sit to Stand: Min guard           General transfer comment: cues for proper UE placement and pt encouraged to place R LE on floor and ed provided for LLE WBAT    Ambulation/Gait Ambulation/Gait assistance: Min guard Gait Distance (Feet): 26 Feet Assistive device: Rolling walker (2 wheels) Gait Pattern/deviations: Step-to pattern, Antalgic, Wide base of support Gait velocity: decreased     General Gait Details: lateral sway noted pt use of B UE to offload R LE in stance phase  Stairs            Wheelchair Mobility    Modified Rankin (Stroke Patients Only)       Balance Overall balance assessment: Needs assistance Sitting-balance support: Bilateral upper extremity supported, Feet supported Sitting balance-Leahy Scale: Fair     Standing balance support: Bilateral upper extremity supported, During functional activity, Reliant on assistive device for balance Standing balance-Leahy Scale: Poor  Pertinent Vitals/Pain Pain Assessment Pain Assessment: 0-10 Pain Score: 8  Pain Location: R knee Pain Descriptors / Indicators: Burning, Discomfort, Operative site guarding Pain Intervention(s): Limited activity within patient's tolerance, Monitored during session, Premedicated before session, Repositioned, Ice applied    Home Living Family/patient expects to be  discharged to:: Private residence Living Arrangements: Spouse/significant other Available Help at Discharge: Family Type of Home: Mobile home Home Access: Stairs to enter Entrance Stairs-Rails: Left Entrance Stairs-Number of Steps: 4   Home Layout: One level Home Equipment: None Additional Comments: iceman machine provided to pt s/p sx    Prior Function Prior Level of Function : Independent/Modified Independent;Working/employed;Driving             Mobility Comments: IND with all ADLs, self care tasks, IADLs and driving and workin g       Hand Dominance        Extremity/Trunk Assessment        Lower Extremity Assessment Lower Extremity Assessment: RLE deficits/detail RLE Deficits / Details: ankle DF 4/5 PF 5/5, SLR < 10 degree lag RLE Sensation: WNL    Cervical / Trunk Assessment Cervical / Trunk Assessment: Normal  Communication   Communication: No difficulties  Cognition Arousal/Alertness: Awake/alert Behavior During Therapy: WFL for tasks assessed/performed Overall Cognitive Status: Within Functional Limits for tasks assessed                                          General Comments General comments (skin integrity, edema, etc.): discussed equipment needs including reacher    Exercises Total Joint Exercises Ankle Circles/Pumps: AROM, Both, 20 reps   Assessment/Plan    PT Assessment Patient needs continued PT services  PT Problem List Decreased strength;Decreased range of motion;Decreased activity tolerance;Decreased balance;Decreased mobility;Decreased coordination;Decreased knowledge of use of DME;Pain       PT Treatment Interventions DME instruction;Gait training;Stair training;Functional mobility training;Therapeutic activities;Therapeutic exercise;Balance training;Neuromuscular re-education;Patient/family education;Modalities    PT Goals (Current goals can be found in the Care Plan section)  Acute Rehab PT Goals Patient Stated  Goal: to be able to return to work full time in August PT Goal Formulation: With patient Time For Goal Achievement: 04/03/23 Potential to Achieve Goals: Good    Frequency 7X/week     Co-evaluation               AM-PAC PT "6 Clicks" Mobility  Outcome Measure Help needed turning from your back to your side while in a flat bed without using bedrails?: A Little Help needed moving from lying on your back to sitting on the side of a flat bed without using bedrails?: A Little Help needed moving to and from a bed to a chair (including a wheelchair)?: A Little Help needed standing up from a chair using your arms (e.g., wheelchair or bedside chair)?: A Little Help needed to walk in hospital room?: A Little Help needed climbing 3-5 steps with a railing? : A Lot 6 Click Score: 17    End of Session Equipment Utilized During Treatment: Gait belt Activity Tolerance: Patient limited by pain;Patient limited by fatigue Patient left: in chair;with call bell/phone within reach;with chair alarm set Nurse Communication: Mobility status;Patient requests pain meds PT Visit Diagnosis: Unsteadiness on feet (R26.81);Other abnormalities of gait and mobility (R26.89);Muscle weakness (generalized) (M62.81);Difficulty in walking, not elsewhere classified (R26.2);Pain Pain - Right/Left: Right Pain - part of body: Knee    Time:  [id number] PT Time Calculation (min) (ACUTE ONLY): 47 min   Charges:   PT Evaluation $PT Eval Low Complexity: 1 Low PT Treatments $Gait Training: 8-22 mins $Therapeutic Activity: 8-22 mins        Baird Lyons, PT   Adair Patter 03/20/2023, 4:14 PM

## 2023-03-20 NOTE — Anesthesia Procedure Notes (Signed)
Anesthesia Regional Block: Adductor canal block   Pre-Anesthetic Checklist: , timeout performed,  Correct Patient, Correct Site, Correct Laterality,  Correct Procedure, Correct Position, site marked,  Risks and benefits discussed,  Surgical consent,  Pre-op evaluation,  At surgeon's request and post-op pain management  Laterality: Right  Prep: chloraprep       Needles:  Injection technique: Single-shot  Needle Type: Echogenic Stimulator Needle     Needle Length: 9cm  Needle Gauge: 21     Additional Needles:   Procedures:,,,, ultrasound used (permanent image in chart),,    Narrative:  Start time: 03/20/2023 6:42 AM End time: 03/20/2023 6:44 AM Injection made incrementally with aspirations every 5 mL.  Performed by: Personally  Anesthesiologist: Nilda Simmer, MD  Additional Notes: Discussed risks and benefits of nerve block including, but not limited to, prolonged and/or permanent nerve injury involving sensory and/or motor function. Monitors were applied and a time-out was performed. The nerve and associated structures were visualized under ultrasound guidance. After negative aspiration, local anesthetic was slowly injected around the nerve. There was no evidence of high pressure during the procedure. There were no paresthesias. VSS remained stable and the patient tolerated the procedure well.

## 2023-03-20 NOTE — Plan of Care (Signed)
  Problem: Education: Goal: Knowledge of the prescribed therapeutic regimen will improve Outcome: Progressing   Problem: Activity: Goal: Ability to avoid complications of mobility impairment will improve Outcome: Progressing   Problem: Pain Management: Goal: Pain level will decrease with appropriate interventions Outcome: Progressing   Problem: Education: Goal: Knowledge of General Education information will improve Description: Including pain rating scale, medication(s)/side effects and non-pharmacologic comfort measures Outcome: Progressing   Problem: Health Behavior/Discharge Planning: Goal: Ability to manage health-related needs will improve Outcome: Progressing   Problem: Activity: Goal: Risk for activity intolerance will decrease Outcome: Progressing   Problem: Nutrition: Goal: Adequate nutrition will be maintained Outcome: Progressing   Problem: Elimination: Goal: Will not experience complications related to bowel motility Outcome: Progressing   Problem: Pain Managment: Goal: General experience of comfort will improve Outcome: Progressing   Problem: Safety: Goal: Ability to remain free from injury will improve Outcome: Progressing

## 2023-03-21 DIAGNOSIS — M1711 Unilateral primary osteoarthritis, right knee: Secondary | ICD-10-CM | POA: Diagnosis not present

## 2023-03-21 LAB — CBC
HCT: 37.9 % (ref 36.0–46.0)
Hemoglobin: 12.2 g/dL (ref 12.0–15.0)
MCH: 29 pg (ref 26.0–34.0)
MCHC: 32.2 g/dL (ref 30.0–36.0)
MCV: 90.2 fL (ref 80.0–100.0)
Platelets: 264 10*3/uL (ref 150–400)
RBC: 4.2 MIL/uL (ref 3.87–5.11)
RDW: 14.2 % (ref 11.5–15.5)
WBC: 17.5 10*3/uL — ABNORMAL HIGH (ref 4.0–10.5)
nRBC: 0 % (ref 0.0–0.2)

## 2023-03-21 LAB — BASIC METABOLIC PANEL
Anion gap: 11 (ref 5–15)
BUN: 19 mg/dL (ref 6–20)
CO2: 21 mmol/L — ABNORMAL LOW (ref 22–32)
Calcium: 8.7 mg/dL — ABNORMAL LOW (ref 8.9–10.3)
Chloride: 103 mmol/L (ref 98–111)
Creatinine, Ser: 0.63 mg/dL (ref 0.44–1.00)
GFR, Estimated: >60 mL/min (ref >60)
Glucose, Bld: 155 mg/dL — ABNORMAL HIGH (ref 70–99)
Potassium: 4.7 mmol/L (ref 3.5–5.1)
Sodium: 135 mmol/L (ref 135–145)

## 2023-03-21 MED ORDER — POLYETHYLENE GLYCOL 3350 17 G PO PACK: 17.0000 g | PACK | Freq: Two times a day (BID) | ORAL | 0 refills | Status: AC

## 2023-03-21 MED ORDER — SENNA 8.6 MG PO TABS: 2.0000 | ORAL_TABLET | Freq: Every day | ORAL | 0 refills | Status: AC

## 2023-03-21 MED ORDER — METHOCARBAMOL 500 MG PO TABS: 500.0000 mg | ORAL_TABLET | Freq: Four times a day (QID) | ORAL | 2 refills | Status: AC | PRN

## 2023-03-21 MED ORDER — ASPIRIN 81 MG PO CHEW: 81.0000 mg | CHEWABLE_TABLET | Freq: Two times a day (BID) | ORAL | 0 refills | Status: AC

## 2023-03-21 NOTE — Plan of Care (Signed)
  Problem: Education: Goal: Knowledge of the prescribed therapeutic regimen will improve Outcome: Progressing   Problem: Activity: Goal: Range of joint motion will improve Outcome: Progressing   Problem: Pain Management: Goal: Pain level will decrease with appropriate interventions Outcome: Progressing   Problem: Safety: Goal: Ability to remain free from injury will improve Outcome: Progressing   

## 2023-03-21 NOTE — Progress Notes (Signed)
Physical Therapy Treatment Patient Details Name: Paula Brown MRN: [id number] DOB: [birth year] Today's Date: 03/21/2023   History of Present Illness 57 yo female presents to therapy s/p R TKA on 03/20/2023 due to failure of conservative measures. Pt PMH includes but is not limited to: thyroid adenoma, OSA on CPAP, L TKA, and HTN.    PT Comments    Pt POD1, reports 9/10 pain but motivated to mobilize with therapy. Pt powers up to stand with heavy use of BUE to assist in rising. Pt with slow, step-to gait pattern, decreased RLE stance time and weight-bearing, limited by pain. RN administered pain medication as pt returned to room via recliner. Pt hopeful to d/c home with spouse who works during the day. Plan to progress mobility as able to complete stair training next session.   Recommendations for follow up therapy are one component of a multi-disciplinary discharge planning process, led by the attending physician.  Recommendations may be updated based on patient status, additional functional criteria and insurance authorization.  Follow Up Recommendations       Assistance Recommended at Discharge Intermittent Supervision/Assistance  Patient can return home with the following A little help with walking and/or transfers;A little help with bathing/dressing/bathroom;Assistance with cooking/housework;Assist for transportation;Help with stairs or ramp for entrance   Equipment Recommendations  Rolling walker (2 wheels);BSC/3in1 (pt aware of potenital cost associated with BSC/3in1)    Recommendations for Other Services       Precautions / Restrictions Precautions Precautions: Knee;Fall Restrictions Weight Bearing Restrictions: No     Mobility  Bed Mobility Overal bed mobility: Needs Assistance Bed Mobility: Supine to Sit  Supine to sit: Supervision, HOB elevated  General bed mobility comments: self assisting RLE to EOB, increased time, pulling on bedrail as needed to upright trunk and  scoot out to EOB    Transfers Overall transfer level: Needs assistance Equipment used: Rolling walker (2 wheels) Transfers: Sit to/from Stand Sit to Stand: Min guard  General transfer comment: cues for hand placement, RLE slightly extended to power up and for controlled descent for comfort    Ambulation/Gait Ambulation/Gait assistance: Min guard Gait Distance (Feet): 22 Feet Assistive device: Rolling walker (2 wheels) Gait Pattern/deviations: Step-to pattern, Antalgic, Wide base of support, Decreased stance time - right, Decreased weight shift to right Gait velocity: decreased  General Gait Details: step to gait pattern, decreased RLE weight-bearing and stance time, wide BOS, somewhat trunk flexion with increased BUE weightbearing on RW   Stairs             Wheelchair Mobility    Modified Rankin (Stroke Patients Only)       Balance Overall balance assessment: Needs assistance Sitting-balance support: Feet supported Sitting balance-Leahy Scale: Good  Standing balance support: Bilateral upper extremity supported, During functional activity, Reliant on assistive device for balance Standing balance-Leahy Scale: Poor     Cognition Arousal/Alertness: Awake/alert Behavior During Therapy: WFL for tasks assessed/performed Overall Cognitive Status: Within Functional Limits for tasks assessed         Exercises Total Joint Exercises Ankle Circles/Pumps: AROM, Both, 20 reps    General Comments        Pertinent Vitals/Pain Pain Assessment Pain Score: 9  Pain Location: R knee Pain Descriptors / Indicators: Burning, Discomfort, Sore Pain Intervention(s): Limited activity within patient's tolerance, Monitored during session, Repositioned, RN gave pain meds during session, Ice applied    Home Living  Prior Function            PT Goals (current goals can now be found in the care plan section) Acute Rehab PT Goals Patient Stated  Goal: to be able to return to work full time in August PT Goal Formulation: With patient Time For Goal Achievement: 04/03/23 Potential to Achieve Goals: Good Progress towards PT goals: Progressing toward goals    Frequency    7X/week      PT Plan Current plan remains appropriate    Co-evaluation              AM-PAC PT "6 Clicks" Mobility   Outcome Measure  Help needed turning from your back to your side while in a flat bed without using bedrails?: A Little Help needed moving from lying on your back to sitting on the side of a flat bed without using bedrails?: A Little Help needed moving to and from a bed to a chair (including a wheelchair)?: A Little Help needed standing up from a chair using your arms (e.g., wheelchair or bedside chair)?: A Little Help needed to walk in hospital room?: A Little Help needed climbing 3-5 steps with a railing? : A Lot 6 Click Score: 17    End of Session Equipment Utilized During Treatment: Gait belt Activity Tolerance: Patient limited by pain Patient left: in chair;with call bell/phone within reach;with chair alarm set Nurse Communication: Mobility status PT Visit Diagnosis: Unsteadiness on feet (R26.81);Other abnormalities of gait and mobility (R26.89);Muscle weakness (generalized) (M62.81);Difficulty in walking, not elsewhere classified (R26.2);Pain Pain - Right/Left: Right Pain - part of body: Knee     Time: [id number] PT Time Calculation (min) (ACUTE ONLY): 13 min  Charges:  $Gait Training: 8-22 mins                      Tori Nik Gorrell PT, DPT 03/21/23, 12:28 PM

## 2023-03-21 NOTE — Progress Notes (Addendum)
   Subjective: 1 Day Post-Op Procedure(s) (LRB): TOTAL KNEE ARTHROPLASTY (Right) Patient reports pain as mild.   Patient seen in rounds with Dr. Alvan Dame. Patient is well, and has had no acute complaints or problems. No acute events overnight. Foley catheter removed. Patient ambulated 26 feet with PT.  We will start therapy today.   Objective: Vital signs in last 24 hours: Temp:  [97.7 F (36.5 C)-98.1 F (36.7 C)] 97.7 F (36.5 C) (04/03 0626) Pulse Rate:  [46-59] 53 (04/03 0626) Resp:  [10-20] 16 (04/03 0626) BP: (101-142)/(60-83) 123/65 (04/03 0626) SpO2:  [93 %-98 %] 95 % (04/03 0626)  Intake/Output from previous day:  Intake/Output Summary (Last 24 hours) at 03/21/2023 0804 Last data filed at 03/21/2023 0700 Gross per 24 hour  Intake 3465 ml  Output 2425 ml  Net 1040 ml     Intake/Output this shift: No intake/output data recorded.  Labs: Recent Labs    03/21/23 0350  HGB 12.2   Recent Labs    03/21/23 0350  WBC 17.5*  RBC 4.20  HCT 37.9  PLT 264   Recent Labs    03/21/23 0350  NA 135  K 4.7  CL 103  CO2 21*  BUN 19  CREATININE 0.63  GLUCOSE 155*  CALCIUM 8.7*   No results for input(s): "LABPT", "INR" in the last 72 hours.  Exam: General - Patient is Alert and Oriented Extremity - Neurologically intact Sensation intact distally Intact pulses distally Dorsiflexion/Plantar flexion intact Dressing - dressing C/D/I Motor Function - intact, moving foot and toes well on exam.   Past Medical History:  Diagnosis Date   Anxiety    Arthritis    Cancer    Dyspnea    with exertion   Hypertension    Pneumonia    Sleep apnea    cpap    Assessment/Plan: 1 Day Post-Op Procedure(s) (LRB): TOTAL KNEE ARTHROPLASTY (Right) Principal Problem:   S/P total knee arthroplasty, right  Estimated body mass index is 34.38 kg/m as calculated from the following:   Height as of this encounter: 5\' 6"  (1.676 m).   Weight as of this encounter: 96.6 kg. Advance  diet Up with therapy D/C IV fluids   Patient's anticipated LOS is less than 2 midnights, meeting these requirements: - Younger than 91 - Lives within 1 hour of care - Has a competent adult at home to recover with post-op recover - NO history of  - Chronic pain requiring opiods  - Diabetes  - Coronary Artery Disease  - Heart failure  - Heart attack  - Stroke  - DVT/VTE  - Cardiac arrhythmia  - Respiratory Failure/COPD  - Renal failure  - Anemia  - Advanced Liver disease     DVT Prophylaxis - Aspirin Weight bearing as tolerated.  Hgb stable at 12.2 this AM.  Pain meds managed by her pain med MD - already has at home  Plan is to go Home after hospital stay. Plan for discharge today following 1-2 sessions of PT as long as they are meeting their goals. Patient is scheduled for OPPT. Follow up in the office in 2 weeks.   Griffith Citron, PA-C Orthopedic Surgery [phone] 03/21/2023, 8:04 AM

## 2023-03-21 NOTE — TOC Transition Note (Signed)
Transition of Care [facility]) - CM/SW Discharge Note  Patient Details  Name: Paula Brown MRN: [id number] Date of Birth: [birth year]  Transition of Care [facility]) CM/SW Contact:  Sherie Don, LCSW Phone Number: 03/21/2023, 11:17 AM  Clinical Narrative: Patient is expected to discharge home after working with PT. CSW met with patient to review discharge plan and needs. Patient will go home with OPPT at [facility]. Patient will need a rolling and requested a 3N1 as well; patient is agreeable to private paying for 3N1. [employer] delivered DME to room. TOC signing off.    Final next level of care: OP Rehab Barriers to Discharge: No Barriers Identified  Patient Goals and CMS Choice CMS Medicare.gov Compare Post Acute Care list provided to:: Patient Choice offered to / list presented to : Patient  Discharge Plan and Services Additional resources added to the After Visit Summary for          DME Arranged: 3-N-1, Walker rolling DME Agency: [employer] Date DME Agency Contacted: 03/21/23 Representative spoke with at DME Agency: Verline Lema  Social Determinants of Health ([place]) Interventions SDOH Screenings   Food Insecurity: No Food Insecurity (03/20/2023)  Housing: Low Risk  (03/20/2023)  Transportation Needs: No Transportation Needs (03/20/2023)  Utilities: Not At Risk (03/20/2023)  Tobacco Use: High Risk (03/20/2023)   Readmission Risk Interventions     No data to display

## 2023-03-21 NOTE — Progress Notes (Signed)
Physical Therapy Treatment Patient Details Name: Paula Brown MRN: [id number] DOB: 03/21/1966 Today's Date: 03/21/2023   History of Present Illness 57 yo female presents to therapy s/p R TKA on 03/20/2023 due to failure of conservative measures. Pt PMH includes but is not limited to: thyroid adenoma, OSA on CPAP, L TKA, and HTN.    PT Comments    Pt in recliner, reporting 7/10 pain at rest and agreeable to therapy. Pt with improved ambulation to 72 ft with RW, demo step-to gait pattern with decreased RLE stance and weight bearing, no knee buckling noted, limited by pain. Once returned to room, pt needing min A to lift RLE back into bed and reposition to comfort. Pt unable to attempt stairs this session and has 5 steps to enter the home; plan to complete stair training tomorrow with hopes to d/c home with family support.   Recommendations for follow up therapy are one component of a multi-disciplinary discharge planning process, led by the attending physician.  Recommendations may be updated based on patient status, additional functional criteria and insurance authorization.  Follow Up Recommendations       Assistance Recommended at Discharge Intermittent Supervision/Assistance  Patient can return home with the following A little help with walking and/or transfers;A little help with bathing/dressing/bathroom;Assistance with cooking/housework;Assist for transportation;Help with stairs or ramp for entrance   Equipment Recommendations  Rolling walker (2 wheels);BSC/3in1 (pt aware of potenital cost associated with BSC/3in1)    Recommendations for Other Services       Precautions / Restrictions Precautions Precautions: Knee;Fall Restrictions Weight Bearing Restrictions: No     Mobility  Bed Mobility Overal bed mobility: Needs Assistance Bed Mobility: Sit to Supine  Supine to sit: Supervision, HOB elevated Sit to supine: Min assist  General bed mobility comments: min A to lift RLE  back into bed, arms pulling on bedrails to pivot into bed    Transfers Overall transfer level: Needs assistance Equipment used: Rolling walker (2 wheels) Transfers: Sit to/from Stand Sit to Stand: Min guard  General transfer comment: BUE assisting to power up, slow to rise with hip extension last    Ambulation/Gait Ambulation/Gait assistance: Min guard Gait Distance (Feet): 72 Feet Assistive device: Rolling walker (2 wheels) Gait Pattern/deviations: Step-to pattern, Antalgic, Wide base of support, Decreased stance time - right, Decreased weight shift to right Gait velocity: decreased  General Gait Details: step-to gait pattern, no knee buckling noted, increased BUE WB on RW with decreased RLE WB in stance   Stairs             Wheelchair Mobility    Modified Rankin (Stroke Patients Only)       Balance Overall balance assessment: Needs assistance Sitting-balance support: Feet supported Sitting balance-Leahy Scale: Good  Standing balance support: Bilateral upper extremity supported, During functional activity, Reliant on assistive device for balance Standing balance-Leahy Scale: Poor      Cognition Arousal/Alertness: Awake/alert Behavior During Therapy: WFL for tasks assessed/performed Overall Cognitive Status: Within Functional Limits for tasks assessed     Exercises Total Joint Exercises Ankle Circles/Pumps: AROM, Both, 20 reps    General Comments        Pertinent Vitals/Pain Pain Assessment Pain Assessment: 0-10 Pain Score: 9  Pain Location: R knee Pain Descriptors / Indicators: Discomfort, Sore, Tender Pain Intervention(s): Limited activity within patient's tolerance, Monitored during session, Premedicated before session, Repositioned, Ice applied    Home Living  Prior Function            PT Goals (current goals can now be found in the care plan section) Acute Rehab PT Goals Patient Stated Goal: to be able to  return to work full time in August PT Goal Formulation: With patient Time For Goal Achievement: 04/03/23 Potential to Achieve Goals: Good Progress towards PT goals: Progressing toward goals    Frequency    7X/week      PT Plan Current plan remains appropriate    Co-evaluation              AM-PAC PT "6 Clicks" Mobility   Outcome Measure  Help needed turning from your back to your side while in a flat bed without using bedrails?: A Little Help needed moving from lying on your back to sitting on the side of a flat bed without using bedrails?: A Little Help needed moving to and from a bed to a chair (including a wheelchair)?: A Little Help needed standing up from a chair using your arms (e.g., wheelchair or bedside chair)?: A Little Help needed to walk in hospital room?: A Little Help needed climbing 3-5 steps with a railing? : A Lot 6 Click Score: 17    End of Session Equipment Utilized During Treatment: Gait belt Activity Tolerance: Patient limited by pain Patient left: in bed;with call bell/phone within reach;with bed alarm set Nurse Communication: Mobility status PT Visit Diagnosis: Unsteadiness on feet (R26.81);Other abnormalities of gait and mobility (R26.89);Muscle weakness (generalized) (M62.81);Difficulty in walking, not elsewhere classified (R26.2);Pain Pain - Right/Left: Right Pain - part of body: Knee     Time: [id number] PT Time Calculation (min) (ACUTE ONLY): 16 min  Charges:  $Gait Training: 8-22 mins                      Tori Vale Mousseau PT, DPT 03/21/23, 2:11 PM

## 2023-03-22 ENCOUNTER — Encounter (HOSPITAL_COMMUNITY): Payer: Self-pay | Admitting: Orthopedic Surgery

## 2023-03-22 DIAGNOSIS — M65861 Other synovitis and tenosynovitis, right lower leg: Secondary | ICD-10-CM | POA: Diagnosis present

## 2023-03-22 DIAGNOSIS — I2510 Atherosclerotic heart disease of native coronary artery without angina pectoris: Secondary | ICD-10-CM | POA: Diagnosis present

## 2023-03-22 DIAGNOSIS — Z91030 Bee allergy status: Secondary | ICD-10-CM | POA: Diagnosis not present

## 2023-03-22 DIAGNOSIS — Z833 Family history of diabetes mellitus: Secondary | ICD-10-CM | POA: Diagnosis not present

## 2023-03-22 DIAGNOSIS — Z79899 Other long term (current) drug therapy: Secondary | ICD-10-CM | POA: Diagnosis not present

## 2023-03-22 DIAGNOSIS — F419 Anxiety disorder, unspecified: Secondary | ICD-10-CM | POA: Diagnosis present

## 2023-03-22 DIAGNOSIS — M1711 Unilateral primary osteoarthritis, right knee: Secondary | ICD-10-CM | POA: Diagnosis not present

## 2023-03-22 DIAGNOSIS — I10 Essential (primary) hypertension: Secondary | ICD-10-CM | POA: Diagnosis present

## 2023-03-22 DIAGNOSIS — Z6834 Body mass index (BMI) 34.0-34.9, adult: Secondary | ICD-10-CM | POA: Diagnosis not present

## 2023-03-22 DIAGNOSIS — Z8249 Family history of ischemic heart disease and other diseases of the circulatory system: Secondary | ICD-10-CM | POA: Diagnosis not present

## 2023-03-22 DIAGNOSIS — F17210 Nicotine dependence, cigarettes, uncomplicated: Secondary | ICD-10-CM | POA: Diagnosis present

## 2023-03-22 DIAGNOSIS — E890 Postprocedural hypothyroidism: Secondary | ICD-10-CM | POA: Diagnosis present

## 2023-03-22 LAB — CBC
HCT: 36.1 % (ref 36.0–46.0)
Hemoglobin: 11.7 g/dL — ABNORMAL LOW (ref 12.0–15.0)
MCH: 28.5 pg (ref 26.0–34.0)
MCHC: 32.4 g/dL (ref 30.0–36.0)
MCV: 88 fL (ref 80.0–100.0)
Platelets: 275 10*3/uL (ref 150–400)
RBC: 4.1 MIL/uL (ref 3.87–5.11)
RDW: 14.4 % (ref 11.5–15.5)
WBC: 13.3 10*3/uL — ABNORMAL HIGH (ref 4.0–10.5)
nRBC: 0 % (ref 0.0–0.2)

## 2023-03-22 MED ORDER — KETOROLAC TROMETHAMINE 15 MG/ML IJ SOLN
7.5000 mg | Freq: Four times a day (QID) | INTRAMUSCULAR | Status: DC
  Administered 2023-03-22: 7.5 mg via INTRAVENOUS
  Filled 2023-03-22: qty 1

## 2023-03-22 NOTE — Progress Notes (Signed)
Physical Therapy Treatment Patient Details Name: Paula Brown MRN: [id number] DOB: March 15, 1966 Today's Date: 03/22/2023   History of Present Illness 57 yo female presents to therapy s/p R TKA on 03/20/2023 due to failure of conservative measures. Pt PMH includes but is not limited to: thyroid adenoma, OSA on CPAP, L TKA, and HTN.    PT Comments    Pt motivated to go home, comes to EOB modified ind. Pt amb in room with RW, good steadiness, no knee buckling, increased UE WB as needed for comfort. Pt confident with stair training, demo ability to ascend/descend steps with L handrail and R axillary crutch, supv for safety. Reviewed HEP at [facility], pt demo good strength, knowledgeable of HEP from prior HEP. Answered all questions, pt reports starting OPPT 4/8, reports ready to d/c home with spouse support to assist as needed while recovering.    Recommendations for follow up therapy are one component of a multi-disciplinary discharge planning process, led by the attending physician.  Recommendations may be updated based on patient status, additional functional criteria and insurance authorization.  Follow Up Recommendations       Assistance Recommended at Discharge Intermittent Supervision/Assistance  Patient can return home with the following A little help with walking and/or transfers;A little help with bathing/dressing/bathroom;Assistance with cooking/housework;Assist for transportation;Help with stairs or ramp for entrance   Equipment Recommendations  Rolling walker (2 wheels);BSC/3in1 (pt aware of potenital cost associated with BSC/3in1)    Recommendations for Other Services       Precautions / Restrictions Precautions Precautions: Knee;Fall Restrictions Weight Bearing Restrictions: No     Mobility  Bed Mobility Overal bed mobility: Needs Assistance Bed Mobility: Supine to Sit, Sit to Supine  Supine to sit: Modified independent (Device/Increase time) Sit to supine: Min assist   General bed mobility comments: increased time without cues to come to sitting EOB, min A to lift RLE last ~4 inches back into bed    Transfers Overall transfer level: Needs assistance Equipment used: Rolling walker (2 wheels) Transfers: Sit to/from Stand Sit to Stand: Supervision  General transfer comment: BUE assisting to power up to stand    Ambulation/Gait Ambulation/Gait assistance: Supervision Gait Distance (Feet): 30 Feet Assistive device: Rolling walker (2 wheels) Gait Pattern/deviations: Step-to pattern, Antalgic, Wide base of support, Decreased stance time - right, Decreased weight shift to right Gait velocity: decreased  General Gait Details: step-to gait pattern, good steadiness, increased UE WB as needed to decrease RLE WB   Stairs Stairs: Yes Stairs assistance: Supervision Stair Management: One rail Left, With crutches, Forwards Number of Stairs: 3 General stair comments: pt ascend/descends 3 steps with L handrail and R crutch, pt confident and steady, good sequencing, supv for safety   Wheelchair Mobility    Modified Rankin (Stroke Patients Only)       Balance Overall balance assessment: Needs assistance Sitting-balance support: Feet supported Sitting balance-Leahy Scale: Good  Standing balance support: Bilateral upper extremity supported, During functional activity, Reliant on assistive device for balance Standing balance-Leahy Scale: Poor     Cognition Arousal/Alertness: Awake/alert Behavior During Therapy: WFL for tasks assessed/performed Overall Cognitive Status: Within Functional Limits for tasks assessed     Exercises Total Joint Exercises Ankle Circles/Pumps: Seated, AROM, Both, 5 reps Quad Sets: Seated, AROM, Strengthening, Both, 5 reps Heel Slides: Seated, AROM, Right, 5 reps Long Arc Quad: Seated, AROM, Right, 5 reps    General Comments        Pertinent Vitals/Pain Pain Assessment Pain Assessment: 0-10 Pain Score: 5  Pain Location:  R knee Pain Descriptors / Indicators: Discomfort, Sore, Tender Pain Intervention(s): Limited activity within patient's tolerance, Monitored during session, Premedicated before session, Repositioned, Ice applied    Home Living                          Prior Function            PT Goals (current goals can now be found in the care plan section) Acute Rehab PT Goals Patient Stated Goal: to be able to return to work full time in August PT Goal Formulation: With patient Time For Goal Achievement: 04/03/23 Potential to Achieve Goals: Good Progress towards PT goals: Progressing toward goals    Frequency    7X/week      PT Plan Current plan remains appropriate    Co-evaluation              AM-PAC PT "6 Clicks" Mobility   Outcome Measure  Help needed turning from your back to your side while in a flat bed without using bedrails?: A Little Help needed moving from lying on your back to sitting on the side of a flat bed without using bedrails?: A Little Help needed moving to and from a bed to a chair (including a wheelchair)?: A Little Help needed standing up from a chair using your arms (e.g., wheelchair or bedside chair)?: A Little Help needed to walk in hospital room?: A Little Help needed climbing 3-5 steps with a railing? : A Little 6 Click Score: 18    End of Session Equipment Utilized During Treatment: Gait belt Activity Tolerance: Patient tolerated treatment well Patient left: with call bell/phone within reach;in bed;with bed alarm set Nurse Communication: Mobility status PT Visit Diagnosis: Unsteadiness on feet (R26.81);Other abnormalities of gait and mobility (R26.89);Muscle weakness (generalized) (M62.81);Difficulty in walking, not elsewhere classified (R26.2);Pain Pain - Right/Left: Right Pain - part of body: Knee     Time: [id number] PT Time Calculation (min) (ACUTE ONLY): 36 min  Charges:  $Gait Training: 8-22 mins $Therapeutic Exercise: 8-22  mins                      Tori Deeana Atwater PT, DPT 03/22/23, 2:41 PM

## 2023-03-22 NOTE — Progress Notes (Signed)
Physical Therapy Treatment Patient Details Name: Paula Brown MRN: [id number] DOB: 1966/09/17 Today's Date: 03/22/2023   History of Present Illness 57 yo female presents to therapy s/p R TKA on 03/20/2023 due to failure of conservative measures. Pt PMH includes but is not limited to: thyroid adenoma, OSA on CPAP, L TKA, and HTN.    PT Comments    Pt with improved pain tolerance, reporting 4/10 this session upon arrival. Pt ambulates short distance to stair case, attempted with single L handrail simulating home environment but pt too fearful. Pt reports having axillary crutches at home, provided single R axillary crutch and pt able to ascend/descend 2 steps with min A+2 for safety and pt comfort. Plan to perform stair training again with pt's family with hopes to d/c home with family support.    Recommendations for follow up therapy are one component of a multi-disciplinary discharge planning process, led by the attending physician.  Recommendations may be updated based on patient status, additional functional criteria and insurance authorization.  Follow Up Recommendations       Assistance Recommended at Discharge Intermittent Supervision/Assistance  Patient can return home with the following A little help with walking and/or transfers;A little help with bathing/dressing/bathroom;Assistance with cooking/housework;Assist for transportation;Help with stairs or ramp for entrance   Equipment Recommendations  Rolling walker (2 wheels);BSC/3in1 (pt aware of potenital cost associated with BSC/3in1)    Recommendations for Other Services       Precautions / Restrictions Precautions Precautions: Knee;Fall Restrictions Weight Bearing Restrictions: No     Mobility  Bed Mobility  General bed mobility comments: in recliner    Transfers Overall transfer level: Needs assistance Equipment used: Rolling walker (2 wheels) Transfers: Sit to/from Stand Sit to Stand: Min guard  General transfer  comment: BUE assisting to power up, RLE slightly extended for comfort    Ambulation/Gait Ambulation/Gait assistance: Supervision Gait Distance (Feet): 30 Feet Assistive device: Rolling walker (2 wheels) Gait Pattern/deviations: Step-to pattern, Antalgic, Wide base of support, Decreased stance time - right, Decreased weight shift to right Gait velocity: decreased  General Gait Details: step to gait pattern, no knee buckling, improved cadence but still slow, decreased RLE WB in stance phase   Stairs Stairs: Yes Stairs assistance: Min assist, +2 safety/equipment Stair Management: One rail Left, With crutches Number of Stairs: 2 General stair comments: pt able to ascend/descend 2 steps with L handrail and R axillary crutch, pt very hesitant needing encouragement and cues and +2 for safety   Wheelchair Mobility    Modified Rankin (Stroke Patients Only)       Balance Overall balance assessment: Needs assistance Sitting-balance support: Feet supported Sitting balance-Leahy Scale: Good  Standing balance support: Bilateral upper extremity supported, During functional activity, Reliant on assistive device for balance Standing balance-Leahy Scale: Poor     Cognition Arousal/Alertness: Awake/alert Behavior During Therapy: WFL for tasks assessed/performed Overall Cognitive Status: Within Functional Limits for tasks assessed       Exercises      General Comments        Pertinent Vitals/Pain Pain Assessment Pain Assessment: 0-10 Pain Score: 4  Pain Location: R knee Pain Descriptors / Indicators: Discomfort, Sore, Tender Pain Intervention(s): Limited activity within patient's tolerance, Monitored during session, Premedicated before session, Repositioned, Ice applied    Home Living                          Prior Function  PT Goals (current goals can now be found in the care plan section) Acute Rehab PT Goals Patient Stated Goal: to be able to return  to work full time in August PT Goal Formulation: With patient Time For Goal Achievement: 04/03/23 Potential to Achieve Goals: Good Progress towards PT goals: Progressing toward goals    Frequency    7X/week      PT Plan Current plan remains appropriate    Co-evaluation              AM-PAC PT "6 Clicks" Mobility   Outcome Measure  Help needed turning from your back to your side while in a flat bed without using bedrails?: A Little Help needed moving from lying on your back to sitting on the side of a flat bed without using bedrails?: A Little Help needed moving to and from a bed to a chair (including a wheelchair)?: A Little Help needed standing up from a chair using your arms (e.g., wheelchair or bedside chair)?: A Little Help needed to walk in hospital room?: A Little Help needed climbing 3-5 steps with a railing? : A Little 6 Click Score: 18    End of Session Equipment Utilized During Treatment: Gait belt Activity Tolerance: Patient tolerated treatment well Patient left: in chair;with call bell/phone within reach;with chair alarm set Nurse Communication: Mobility status PT Visit Diagnosis: Unsteadiness on feet (R26.81);Other abnormalities of gait and mobility (R26.89);Muscle weakness (generalized) (M62.81);Difficulty in walking, not elsewhere classified (R26.2);Pain Pain - Right/Left: Right Pain - part of body: Knee     Time: 0940-1005 PT Time Calculation (min) (ACUTE ONLY): 25 min  Charges:  $Gait Training: 23-37 mins                      Tori Nonnie Pickney PT, DPT 03/22/23, 12:23 PM

## 2023-03-22 NOTE — Plan of Care (Signed)
  Problem: Education: Goal: Knowledge of the prescribed therapeutic regimen will improve Outcome: Progressing   Problem: Pain Management: Goal: Pain level will decrease with appropriate interventions Outcome: Progressing   Problem: Activity: Goal: Risk for activity intolerance will decrease Outcome: Progressing   

## 2023-03-22 NOTE — Progress Notes (Cosign Needed)
    HPI: Patient is a 57 year old female who is POD- 2 from a s/p total knee arthroplasty, RIGHT Patient seen by Selinda Michaels PA-S with Costella Hatcher PA-C for Dr. Alvan Dame.   Patient is currently lying comfortably in bed and states that she did sleep well overnight. She reports that her pain is not currently controled with pain medication but it is worse with movement of her leg. Patient ambulated twice with PT yesterday. Patient was not able to ambulate in th first session due to limitations from pain. The second PT session she ambulated  42' with RW with min assistance. No acute events overnight Teds, SCD's and ice packs are in place.Patient denies chest pain, abdominal pain and SHOB.      Vitals reviewed I/O reviewed Labs reviewed Medications reviewed PMH reviewed Images reviewed  Hgb: 11.7, acute blood loss anemia     Physical Exam:   General: well appearing female who is alert, cooperative, pleasant and in NAD Skin: warm, dry and intact Resp: Normal effort of respiration, no signs of respiratory distress   MSK: right lower extremity is warm but not erythematous, there is minimal swelling of the right lower extremity localized around the knee joint. There is no ecchymosis. Compartments are soft. Dressing is C/D/I. Patient is able to wiggle toes. Plantarflexion and dorsiflexion are intact.  Neurovascular: sensation and distal pulses are intact in bilateral lower extremities        Assessment: S/p right total knee arthroplasty    Plan: Start Toradol PO for pain  Continue to monitor vitals  Continue PT WBAT Continue DVT Prophylaxis: ASA OPPT scheduled with [facility]  Follow-up in the clinic is scheduled for 2 weeks post-op with Dr. Alvan Dame   Patient to be discharged home today if goals are met with PT and pain is well controlled.  PDMP was reviewed before opioids were prescribed to patient.    Signed Selinda Michaels PA-S

## 2023-03-22 NOTE — Progress Notes (Signed)
   Subjective: 2 Days Post-Op Procedure(s) (LRB): TOTAL KNEE ARTHROPLASTY (Right) Patient reports pain as mild.   Patient seen in rounds for Dr. Alvan Dame. Patient is well, and has had no acute complaints or problems. No acute events overnight.  Patient ambulated 70+ feet with PT but was not able to complete stair training. She is having significant pain, which she recalls from her prior TKA. We will continue therapy today.   Objective: Vital signs in last 24 hours: Temp:  [97.9 F (36.6 C)-98.7 F (37.1 C)] 98 F (36.7 C) (04/04 0515) Pulse Rate:  [55-60] 60 (04/04 0515) Resp:  [15-17] 17 (04/04 0515) BP: (122-171)/(52-84) 128/65 (04/04 0515) SpO2:  [93 %-97 %] 93 % (04/04 0515)  Intake/Output from previous day:  Intake/Output Summary (Last 24 hours) at 03/22/2023 0853 Last data filed at 03/22/2023 0649 Gross per 24 hour  Intake 750 ml  Output 1750 ml  Net -1000 ml     Intake/Output this shift: No intake/output data recorded.  Labs: Recent Labs    03/21/23 0350 03/22/23 0327  HGB 12.2 11.7*   Recent Labs    03/21/23 0350 03/22/23 0327  WBC 17.5* 13.3*  RBC 4.20 4.10  HCT 37.9 36.1  PLT 264 275   Recent Labs    03/21/23 0350  NA 135  K 4.7  CL 103  CO2 21*  BUN 19  CREATININE 0.63  GLUCOSE 155*  CALCIUM 8.7*   No results for input(s): "LABPT", "INR" in the last 72 hours.  Exam: General - Patient is Alert and Oriented Extremity - Neurologically intact Sensation intact distally Intact pulses distally Dorsiflexion/Plantar flexion intact Dressing - dressing C/D/I Motor Function - intact, moving foot and toes well on exam.   Past Medical History:  Diagnosis Date   Anxiety    Arthritis    Cancer    Dyspnea    with exertion   Hypertension    Pneumonia    Sleep apnea    cpap    Assessment/Plan: 2 Days Post-Op Procedure(s) (LRB): TOTAL KNEE ARTHROPLASTY (Right) Principal Problem:   S/P total knee arthroplasty, right  Estimated body mass index  is 34.38 kg/m as calculated from the following:   Height as of this encounter: 5\' 6"  (1.676 m).   Weight as of this encounter: 96.6 kg. Advance diet Up with therapy D/C IV fluids    DVT Prophylaxis - Aspirin Weight bearing as tolerated.  Will give 2 doses of toradol today for pain benefit. Pain managed by her pain MD at discharge  Plan is to go Home after hospital stay. Plan for discharge today following 1-2 sessions of PT as long as they are meeting their goals. Patient is scheduled for OPPT. Follow up in the office in 2 weeks.   Griffith Citron, PA-C Orthopedic Surgery [phone] 03/22/2023, 8:53 AM

## 2023-03-22 NOTE — Plan of Care (Signed)
  Problem: Education: Goal: Knowledge of the prescribed therapeutic regimen will improve Outcome: Progressing   Problem: Activity: Goal: Range of joint motion will improve Outcome: Progressing   Problem: Pain Management: Goal: Pain level will decrease with appropriate interventions Outcome: Progressing   Problem: Safety: Goal: Ability to remain free from injury will improve Outcome: Progressing   

## 2023-03-28 NOTE — Discharge Summary (Signed)
Patient ID: Naraly Goodloe MRN: [id number] DOB/AGE: 59-10-1966 59 y.o.  Admit date: 03/20/2023 Discharge date: 03/22/2023  Admission Diagnoses:  Right knee osteoarthritis  Discharge Diagnoses:  Principal Problem:   S/P total knee arthroplasty, right   Past Medical History:  Diagnosis Date   Anxiety    Arthritis    Cancer    Dyspnea    with exertion   Hypertension    Pneumonia    Sleep apnea    cpap    Surgeries: Procedure(s): TOTAL KNEE ARTHROPLASTY on 03/20/2023   Consultants:   Discharged Condition: Improved  Hospital Course: Clorinda Ozog is an 57 y.o. female who was admitted 03/20/2023 for operative treatment ofS/P total knee arthroplasty, right. Patient has severe unremitting pain that affects sleep, daily activities, and work/hobbies. After pre-op clearance the patient was taken to the operating room on 03/20/2023 and underwent  Procedure(s): TOTAL KNEE ARTHROPLASTY.    Patient was given perioperative antibiotics:  Anti-infectives (From admission, onward)    Start     Dose/Rate Route Frequency Ordered Stop   03/20/23 1400  ceFAZolin (ANCEF) IVPB 2g/100 mL premix        2 g 200 mL/hr over 30 Minutes Intravenous Every 6 hours 03/20/23 1341 03/20/23 2106   03/20/23 0600  ceFAZolin (ANCEF) IVPB 2g/100 mL premix        2 g 200 mL/hr over 30 Minutes Intravenous On call to O.R. 03/20/23 1219 03/20/23 7588        Patient was given sequential compression devices, early ambulation, and chemoprophylaxis to prevent DVT. Patient worked with PT and was meeting their goals regarding safe ambulation and transfers.  Patient benefited maximally from hospital stay and there were no complications.    Recent vital signs: No data found.   Recent laboratory studies: No results for input(s): "WBC", "HGB", "HCT", "PLT", "NA", "K", "CL", "CO2", "BUN", "CREATININE", "GLUCOSE", "INR", "CALCIUM" in the last 72 hours.  Invalid input(s): "PT", "2"   Discharge Medications:   Allergies as  of 03/22/2023       Reactions   Bee Venom Anaphylaxis        Medication List     TAKE these medications    amLODipine-benazepril 10-40 MG capsule Commonly known as: LOTREL Take 1 capsule by mouth daily.   aspirin 81 MG chewable tablet Chew 1 tablet (81 mg total) by mouth 2 (two) times daily for 28 days.   celecoxib 200 MG capsule Commonly known as: CELEBREX Take 200 mg by mouth daily.   citalopram 40 MG tablet Commonly known as: CELEXA Take 40 mg by mouth daily.   EPINEPHrine 0.3 mg/0.3 mL Soaj injection Commonly known as: EpiPen 2-Pak Inject 0.3 mLs (0.3 mg total) into the muscle once as needed (for severe allergic reaction). CAll 911 immediately if you have to use this medicine   fluticasone 110 MCG/ACT inhaler Commonly known as: FLOVENT HFA Inhale 2 puffs then rinse mouth, twice daily   hydrochlorothiazide 12.5 MG capsule Commonly known as: MICROZIDE Take 12.5 mg by mouth daily.   methocarbamol 500 MG tablet Commonly known as: ROBAXIN Take 1 tablet (500 mg total) by mouth every 6 (six) hours as needed for muscle spasms.   metoprolol succinate 50 MG 24 hr tablet Commonly known as: TOPROL-XL Take 50 mg by mouth daily. Take with or immediately following a meal.   omeprazole 40 MG capsule Commonly known as: PRILOSEC Take 40 mg by mouth daily.   oxyCODONE-acetaminophen 10-325 MG tablet Commonly known as: PERCOCET Take 1 tablet by  mouth every 8 (eight) hours.   polyethylene glycol 17 g packet Commonly known as: MIRALAX / GLYCOLAX Take 17 g by mouth 2 (two) times daily.   senna 8.6 MG Tabs tablet Commonly known as: SENOKOT Take 2 tablets (17.2 mg total) by mouth at bedtime for 14 days.               Discharge Care Instructions  (From admission, onward)           Start     Ordered   03/21/23 0000  Change dressing       Comments: Maintain surgical dressing until follow up in the clinic. If the edges start to pull up, may reinforce with tape. If  the dressing is no longer working, may remove and cover with gauze and tape, but must keep the area dry and clean.  Call with any questions or concerns.   03/21/23 0811            Diagnostic Studies: No results found.  Disposition: Discharge disposition: 01-Home or Self Care       Discharge Instructions     Call MD / Call 911   Complete by: As directed    If you experience chest pain or shortness of breath, CALL 911 and be transported to the hospital emergency room.  If you develope a fever above 101 F, pus (white drainage) or increased drainage or redness at the wound, or calf pain, call your surgeon's office.   Change dressing   Complete by: As directed    Maintain surgical dressing until follow up in the clinic. If the edges start to pull up, may reinforce with tape. If the dressing is no longer working, may remove and cover with gauze and tape, but must keep the area dry and clean.  Call with any questions or concerns.   Constipation Prevention   Complete by: As directed    Drink plenty of fluids.  Prune juice may be helpful.  You may use a stool softener, such as Colace (over the counter) 100 mg twice a day.  Use MiraLax (over the counter) for constipation as needed.   Diet - low sodium heart healthy   Complete by: As directed    Increase activity slowly as tolerated   Complete by: As directed    Weight bearing as tolerated with assist device (walker, cane, etc) as directed, use it as long as suggested by your surgeon or therapist, typically at least 4-6 weeks.   Post-operative opioid taper instructions:   Complete by: As directed    POST-OPERATIVE OPIOID TAPER INSTRUCTIONS: It is important to wean off of your opioid medication as soon as possible. If you do not need pain medication after your surgery it is ok to stop day one. Opioids include: Codeine, Hydrocodone(Norco, Vicodin), Oxycodone(Percocet, oxycontin) and hydromorphone amongst others.  Long term and even short  term use of opiods can cause: Increased pain response Dependence Constipation Depression Respiratory depression And more.  Withdrawal symptoms can include Flu like symptoms Nausea, vomiting And more Techniques to manage these symptoms Hydrate well Eat regular healthy meals Stay active Use relaxation techniques(deep breathing, meditating, yoga) Do Not substitute Alcohol to help with tapering If you have been on opioids for less than two weeks and do not have pain than it is ok to stop all together.  Plan to wean off of opioids This plan should start within one week post op of your joint replacement. Maintain the same interval or time between  taking each dose and first decrease the dose.  Cut the total daily intake of opioids by one tablet each day Next start to increase the time between doses. The last dose that should be eliminated is the evening dose.      TED hose   Complete by: As directed    Use stockings (TED hose) for 2 weeks on both leg(s).  You may remove them at night for sleeping.        Follow-up Information     Durene Romanslin, Matthew, MD. Schedule an appointment as soon as possible for a visit in 2 week(s).   Specialty: Orthopedic Surgery Contact information: [address] [place] [address] [phone]                  Signed: Cassandria Angershley R Lacey Dotson 03/28/2023, 8:58 AM

## 2023-07-16 ENCOUNTER — Other Ambulatory Visit: Payer: Self-pay | Admitting: Internal Medicine

## 2023-07-16 DIAGNOSIS — N6489 Other specified disorders of breast: Secondary | ICD-10-CM

## 2024-06-13 NOTE — Progress Notes (Signed)
 Occupational Therapy  [facility]  Acute Occupational Therapy Treatment    Patient Name: Emma Pena    Patient seen today by inpatient occupational therapy services to address identified deficits and plan of care per evaluation.     Treatment Diagnosis:   Muscle weakness M62.81, Difficulty walking R26.2, Pain in left shoulder M25.512    Hospital Course:  58yo F c PMHx significant for HTN, chronic pain s/p B knee replacements who presents to acute OT s/p fall at [facility] in which she sustained L 4-7rib fxs, L humeral head/neck fx. NWB LUE in sling.    Occupational Therapy Assessment:   Pt is making good progress towards goals; currently presenting below baseline level of function and would benefit from further acute OT services to optimize ADL and functional mobility to maximize independence and safety. Pt performance is limited by pain and orthopaedic restrictions; once pain is better managed, pt performance is anticipated to improve. Based on today's session, in order to maximize independence and safety at discharge, anticipate pt discharge plan to be Intermittent support with or without therapy for post-acute needs, home health services vs outpatient services (prescription required).       Today's Functional Status:  Patient needs maximum assistance (verbal education on clothing styles to support (I), decreased burden of care. Pain limiting active participation. Pt verbalized comp to education.) for upper body dressing, minimal assistance (pt guided through pant donning  c cues for RLE threading first d/t decreased active ROM in RLE compared to L. Pt returned demo well c minA for adjusting clothing over L hip only. Increased time for sit<>stand t/f.) for lower body dressing, minimal assistance for grooming ADLs, minimal assistance (minA for donning underwear over L hip; pt reporting IV limiting funcitonal reach. Able to complete hygiene well.) for toileting, and contact guard assistance for functional  transfers/mobility.           OT Recommendations for Post Acute Care Needs:  Intermittent support with or without therapy for post-acute needs, home health services vs outpatient services (prescription required)       Current Equipment Needs for a Community-Based Discharge:   no equipment          Patient will benefit from ongoing OT intervention to address current deficits and assist in maximizing independence as patient medically improves.  Patient will be followed per POC listed below to address limitation noted for stated goals below.     Occupational Therapy Plan and Treatment:  Recommendations: Continue OT  Treatment frequency: at least 3x/week  Duration: 10  Visit number: 3/10  Treatment interventions: ADL retraining, functional transfer training, endurance training, UE strengthening/ROM, patient/family training, equipment evaluation/education, compensatory technique education, continued evaluation  Plan established with: patient, treatment team  Patient/Family Plan Agreement: are in agreement                Precautions:  General Precautions: falls  Weight Bearing Status: NWB LUE  Precautions Comment: LUE NWB in sling. Per SRO note: OK to use elbow/wrist/hand for ADLs such as hygiene; OK to come out of sling/collar for elbow and wrist ROM    Pain Affecting Therapy Session:  Is the patient currently experiencing pain that interferes with therapy?: Yes  Pain Score During Therapy: 8 - Severe Pain  Pain Location During Therapy: LUE, primarily in ribs  Pain Intervention(s) During Therapy: RN notified (comment), Repositioned, Ambulation/increased activity, Distraction, Emotional support (meds provided at beginning of session.)    Subjective      RN, pt agreeable to  OT.    Current Findings:            R UE Assessment  R UE Assessment: WFL    L UE Assessment  L UE Assessment: impaired  LUE Comment: Pt re-guided through A/AAROM of distal LUE including sup/pro, wrist flex/ext, fist formation. SILT. pain limiting  participation in elbow ROM; provided visual demo of AA/PROM for elbow c use of RUE to which pt verbalized comp; modeled minimally wihtin confines of sling.    Functional Mobility  Bed Mobility: minimal assistance (increased time, cues for hip positioning to increase (I) c t/f. Re-educated re: sleeping in recliner; pt receptive.)  ADL Transfers: contact guard assistance  Sitting Balance: independent  Standing Balance: stand-by assistance  Functional Mobility Comment: pt completed sup-sit c HOB elevated and minA. Cues to scoot towards EOB prior to standing; CGA sit<>stand c cane from EOB, commode, recliner. Provided verbal cues and demo re: safe positioning for proximity, awareness during descending portions of t/fs; pt verbalized comp and demo comp during recliner t/fs. SbA dynamic standing balance at sink for unilateral bADL.                   ADL  Feeding: independent  Grooming : minimal assistance  UB Bathing: minimal assistance (verbal education on strategies to support underarm cleaning/hygiene; pt verbalized comp.)  LB Bathing: minimal assistance  UB Dressing : maximum assistance (verbal education on clothing styles to support (I), decreased burden of care. Pain limiting active participation. Pt verbalized comp to education.)  LB Dressing: minimal assistance (pt guided through pant donning  c cues for RLE threading first d/t decreased active ROM in RLE compared to L. Pt returned demo well c minA for adjusting clothing over L hip only. Increased time for sit<>stand t/f.)  Toileting : minimal assistance (minA for donning underwear over L hip; pt reporting IV limiting funcitonal reach. Able to complete hygiene well.)  ADL Comment: Per clinical judgement, assessment and observation of functional status during evaluation. pt completed toileting c minA, sinkside oral hygiene c s/u, LBD c minA. Sling management totalA. Education re: home set up to support increased (I) and safety via modifying placement of bADL items;  verbal edu on EC/WS; pt verbalized comp.    Activity Tolerance/Miscellaneous  Activity Tolerance Comment: fair  Miscellaneous Comments: IS education reinforced, importance of mobility reinforced.    Standardized Scores:                           AMPAC 6 Clicks 3.0  - How much help from another person does the patient currently need (can use assistive devices):   Putting on and taking off regular lower body clothing?: Needs a little help - min assist to supervision  Bathing (includiing washing, rinsing, drying)?: Needs a lot of help - mod to max assist  Toileting includes using toilet, bedpan or urinal?: Needs a little help - min assist to supervision  Putting on and taking off regular upper body clothing?: Needs a lot of help - mod to max assist  Taking care of personal grooming such as brushing teeth?: Needs no help - independent  Eating Meals?: Needs no help - independent  AMPAC 6 Clicks 3.0 Total Score: 18                                  Highest Attempted ADL Level: 4 - Standing  Interventions:  Interventions  Interventions: ADL training, UE rehab, positioning, functional mobility training, cueing    Education:  Patient, Treatment team  OT role, POC, recommendations, precautions, IS, extremity care/recommendations, benefits of mobility, active participation in ADL's  applied knowledge, verbalized understanding, demonstrated skill, needs practice       Patient Position at End of Session:  Patient positioning at end of visit: in chair, call bell in reach, all needs met       Goals:  Goals established with patient, treatment team.  Pt/family in agreement with goals:are in agreement.    Patient/caregiver goal: To return to PLOF.    Goals for Current Episode of Care  Toileting: Pt will complete toileting at mod(I).  Goal Status: Progressing  Transfer: Pt will complete all functional t/fs at mod(I).  Goal Status: Progressing  Bathing UB: Pt will demonstrate UBD at mod(I).  Goal Status:  Progressing  Dressing UB: Pt will complete UBD at mod(I).  Goal Status: Progressing  Dressing LB: Pt will complete LBD at mod(I).  Goal Status: Progressing    Additional Goals  Patient/caregiver goal: To return to PLOF.  Goal Status: Progressing              OT Therapy Session  OT Time In: 1130  OT Time Out: 1202  Time Calculation (min): 32 min  Session Type: Treatment only           Time Based Charges: Total Minutes  32     Self Care/Home Management Training minutes: 32, $ Self Care/Home Management Training (CPT [phone]): 2 units                             If patient does not receive any additional therapy (due to hospital discharge or unanticipated discharge), the functional status, progress, and indicated goal statuses in this therapy note will serve as the therapy discharge summary.

## 2024-06-13 NOTE — Discharge Summary (Signed)
 -------------------------------------------------------------------------------  Attestation signed by Donnice Ivonne Salter, MD at 06/13/2024 10:45 AM  Attending Attestation: I saw and evaluated the patient, cleared for discharge home.  -------------------------------------------------------------------------------    [facility]  Discharge Summary    Date of Evaluation:  06/13/2024    Admit date: 06/11/2024    Discharge date and time: 06/13/2024 @ 1015    Discharge Physician: Ardeth Manning, PA-C; Donnice Salter, MD    Admission Diagnoses: Fall [W19.XXXA]  Fall, initial encounter [id number]]  Closed fracture of multiple ribs of left side, initial encounter [S22.42XA]  Other closed nondisplaced fracture of proximal end of left humerus, initial encounter [S42.295A]    Discharge Diagnoses:   Patient Active Problem List   Diagnosis   . Fall   . Closed fracture of multiple ribs of left side   . Humerus head fracture, left, closed, initial encounter     Procedures: none    HPI: 58 y.o. female who presented as a trauma consult.  Patient presented from [facility] where she was taken by [place].  Pt had just returned from a trip from [place]  and reports her shoe got stuck on the floor causing her to fall landing on her L side.  Denies hitting her head or losing consciousness. Complaining of L chest wall pain and L shoulder pain.  Workup at outside ED demonstrated L 4-7 rib fractures and L humeral head fracture. She does not take blood thinners. 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     Hospital Course: Patient was admitted for pain control and respiratory support.   She was seen by ortho with recommendations for non-operative management of her humeral head fracture, treatment in sling.  She was started on multi-modal pain control.  Patient underwent tertiary survey on PTD#1 which revealed no new injuries.    PT and OT were consulted with  recommendations for home with or without therapy for post-acute needs, home health services vs outpatient services.    On the day of discharge patient was afebrile, vital signs stable, tolerating a regular diet without nausea or vomiting, pain was controlled with oral pain medications, and she was ambulating and voiding independently. Patient deemed medically stable for discharge.  She was discharged on hospital day 0.    Discharge Medications:      Discharge Medications        .      acetaminophen  325 MG tablet  Take 2 tablets (650 mg total) by mouth every 6 (six) hours as needed for mild pain for up to 30 days.  Commonly known as: TYLENOL      docusate sodium  100 MG capsule  Take 2 capsules (200 mg total) by mouth nightly for 7 days.  Commonly known as: COLACE     lidocaine  4 %  Place 1 patch on the skin daily for 7 days.  Commonly known as: SALONPAS  Start taking on: June 14, 2024     methocarbamoL 500 MG tablet  Take 1 tablet (500 mg total) by mouth every 6 (six) hours for 10 days.  Commonly known as: ROBAXIN     polyethylene glycol 17 gram packet  Take 17 g by mouth daily.  Commonly known as: MIRALAX              Consults: orthopedic surgery    Imaging:   XR Shoulder Left  Result Date: 06/12/2024  IMPRESSION: *  No evidence of active cardiopulmonary  disease. *  Fracture at left humeral head and neck.     XR Humerus Left  Result Date: 06/12/2024  IMPRESSION: *  No evidence of active cardiopulmonary disease. *  Fracture at left humeral head and neck.     XR Chest Portable  Result Date: 06/12/2024  IMPRESSION: *  No evidence of active cardiopulmonary disease. *  Fracture at left humeral head and neck.     CT Chest/Abdomen/Pelvis With IV Contrast  Result Date: 06/11/2024  IMPRESSION: 1.  Comminuted fracture of the left humeral head. 2.  Nondisplaced and minimally displaced left fourth through seventh rib fractures. No pneumothorax or pleural effusion. 3.  No acute abnormality in the abdomen or pelvis.    CT Head Without  IV Contrast  Result Date: 06/11/2024  IMPRESSION: 1.  No acute intracranial abnormality. Moderate chronic small vessel disease. 2.  No acute fracture or malalignment of the cervical spine.    CT Spine Cervical Without IV Contrast  Result Date: 06/11/2024  IMPRESSION: 1.  No acute intracranial abnormality. Moderate chronic small vessel disease. 2.  No acute fracture or malalignment of the cervical spine.      Vitals:    06/13/24 0912   BP:    Pulse:    Resp: 16   Temp:    SpO2:        Physical Exam:  BP 178/82 (BP Location: Right upper arm, BP Position: Supine, Cuff Size: Adult (Navy Blue))   Pulse 54   Temp 98.3 F (36.8 C) (Oral)   Resp 16   Ht 1.651 m (5' 5)   Wt (!) 101.1 kg (222 lb 14.2 oz)   SpO2 95%   BMI 37.09 kg/m   Gen: NAD  HEENT: NC/AT, mucous membranes moist, EOMs intact  Neck: trachea is midline  CV: regular rate  Lung: unlabored breathing on RA  Ext: sling to LUE  Neuro: grossly intact  Skin: warm and dry  Psych: mood normal, appropriate affect    Disposition: Home    Follow up: see below    Other Instructions       Discharge instructions      Discharge Instructions:    Pain Medications  - You should take acetaminophen  (Tylenol ) 650 mg every 6 hours as needed until your pain resolves.   - You should also apply ice to your incisions and can use lidocaine  patches (Salonpas) near your incisions.   - You should take robaxin as prescribed to help control your pain.  - You can use your home percocet  as prescribed to help control your pain.  - Please note that prescription pain medication refills will not be called in to your pharmacy. If you feel that you are needing more pain medication, you will need to be seen in clinic or in the Emergency Department for further evaluation to ensure you are not experiencing a complication. Please take note of how many pills you have left in your bottle, and if you are starting to run low and feel that you will need more for adequate pain control, please call the  clinic as soon as possible to schedule an appointment.  - If you have not had a bowel movement in 1-2 days, please take a stool softener such as Colace or a laxative such as Miralax.    Other Medications  - Home Medications: okay to resume home medications.    Activity  - You should stay active. Take a walk at least 3 times per day.  -  Do not drive or drink alcohol while you are taking narcotics. Do not drive until you are able to make sudden movements (i.e. braking and turning the wheel) without pain.     Weightbearing  - Do not bear weight to your left arm, keep in sling, it is okay to use elbow/wrist/hand for activities of daily living (hygiene), it is okay to come out of sling/collar for elbow and wrist range of motion    Call Dr. Mima office if you experience:  - Fever >101 F by mouth  - Uncontrolled nausea or vomiting  - Pain uncontrolled by prescribed pain medication  - Bleeding, drainage, redness, warmth, swelling, or separation at your incisions     Rib Fractures, Pulmonary Contusions  - Continue directed cough, incentive spirometry, or acapella therapy as instructed by nursing until directed to stop at your follow up in trauma clinic.      Follow Up Appointments:   - You may follow up with trauma surgery as needed, call to make an appointment below.  - Follow up in 7-10 days with orthopedic surgery, call to make an appointment at [facility] [phone]  - You should follow-up with your primary care provider to inform them of your hospitalization.    Trauma Surgery Office Number:  [phone]              The [place]  Controlled Substances Reporting System ([place] CSRS) was utilized to query any controlled substance prescription information over the preceding 12 months regarding this patient as per the [place]  Strengthen Opioid Misuse Prevention (STOP) Act.    The patient was instructed on indications for seeking medical attention. The patient will follow up sooner than instructed if they  experience fevers, chills, sweats or uncontrolled pain that is not relieved with the pain medication. After physical examination and a review of our findings and recommendations, the patient was cleared for discharge.     Time Expenditure:  I spent less than 30 minutes in discharge management. Including my final physical exam, summarizing the hospital course and giving discharge instructions to the patient, coordinating follow up appointments, reviewing and preparing medications and preparing discharge documents.     Ardeth Earnie Manning, PA-C  General Surgery & Trauma Department  [facility]  06/13/2024 10:18 AM

## 2024-06-13 NOTE — Progress Notes (Signed)
 Physical Therapy  Physical Therapy Contact Note  PT evaluation complete, full note to follow.    Pt demo bed mobility with 1 person assistance, STS w/ SBA, Amb x 50 ft with SPC and SBA. Pt is primarily limited by pain.     06/13/24 1000   Discharge Planning Recommendations   Recommendations for PT Post Acute Care Discharge Needs Intermittent support with or without therapy for post-acute needs;home health services vs outpatient services (prescription required)   Current PT equipment needs for a community based discharge single point cane   Additional factors impacting patient's current functional safety needs at discharge include Pts husband works during the day, so is not able to be home 24/7.

## 2024-07-07 ENCOUNTER — Encounter
Admit: 2024-07-07 | Discharge: 2024-07-07 | Payer: PRIVATE HEALTH INSURANCE | Attending: Rehabilitative and Restorative Service Providers"

## 2024-07-07 DIAGNOSIS — S42202A Unspecified fracture of upper end of left humerus, initial encounter for closed fracture: Principal | ICD-10-CM

## 2024-07-07 NOTE — Progress Notes (Addendum)
 Physical Therapy Daily Treatment Note    Date: 07/07/2024  Patient Name: Emma Pena  DOB: 03-15-1966   MRN: [id number]  DOInjury: 06/11/2024  DOSx: --   Referring Provider: McClung, Kevan, PA  [address]  [address]  [place],  [place] [address]     Medical Diagnosis:      Diagnosis Orders   1. Closed fracture of proximal end of left humerus, unspecified fracture morphology, initial encounter        Patient is 4 weeks post proximal humerus fracture (non-op).  Patient is limited in all directions along with impaired strength, function, weightbearing tolerance.  Treatment will start with PROM.  Called and left message with Provider to see if he wants us  to progress beyond PROM while she is here in [facility] for the next 3 weeks.  If he does want progression, treatment will also consist of AAROM, AROM, and wand exercises will begin.     Addendum 07/08/24: Emma Pena, Emma Pena's provider returned my call about potential progression.  He said some AAROM is ok, but PROM is what he wants most.  No full AROM.  No lifting.  His contact information is [phone]. Emma Pena, PT     Precautions/Contraindications: Script for passive ROM only.  Phone call out to provider to see if they want progression beyond PROM.  Returns to physician in 2-3 weeks     Access Code: [email or web address]  URL: [email or web address]  Date: 07/07/2024  Prepared by: Emma Pena    Exercises  - Circular Shoulder Pendulum with Table Support  - 2 x daily - 1 sets - 30 reps    X = TO BE PERFORMED NEXT VISIT  > = PROGRESS TO THIS FIRST  >> = PROGRESS TO THIS SECOND  >>> = PROGRESS TO THIS THIRD    S: See eval  O:    Time 9154-9069     Visit 1/3-6 Repeat outcome measure at mid point and end.    Pain Pain 5-8/10     ROM Right Shoulder:  AROM: 180 Forward elevation,  90 ER,  IR to T12  PROM: 180 Forward elevation,  90 ER,  40 IR     Left Shoulder:  AROM: Not assessed due to passive phase of recovery  PROM: 95 Forward elevation (empty end-feel),  50  ER (empty end-feel) , 55 IR     Modalities      Heat / Ice  prn  MO   Manual      Stretching all directions   MT   PROM all directions  X 15 min     Stretch      Table slides   TE   Wall Flexion   TE   Wall ER stretch   TE   IR stretch behind back   TE      TE   Exercise      Elbow AROM HEP     Hand, wrist, finger, forearm AROM HEP        TE   Pendulum Ex X 20  TE   Gear shift exercise   TE   Pulleys - flex   TE   Pulleys-IR   TE   Supine wand chest press   TE   Supine wand flex   TE   Supine wand ER/IR (arms at sides)   TE   Standing wand behind back IR   TE   Supine flexion   TE   S-lying ER   TE  Standing wand flex   TE   Standing flexion   TE   ROWS: H   TA   ROWS: M   TA   ROWS: L   TA   ER (towel roll under arm)   TE   IR (towel roll under arm)   TE   Flexion in scapular plane      Shelf Lift      Shoulder press      Biceps curl      Triceps press down                  A:  Tolerated well.  Reviewed home exercise program extensively; written copy provided.    P: Continue with rehab plan.  Will bring sister to follow-up appointment for PROM instruction.   Emma Pena, PT    Treatment Charges: Mins Units   Initial Evaluation 30 1   Re-Evaluation     Ther Exercise         TE     Manual Therapy     MT 15 1   Ther Activities        TA     Gait Training          GT     Neuro Re-education NR     Modalities     Non-Billable Service Time     Other     Total Time/Units 45 2

## 2024-07-07 NOTE — Progress Notes (Signed)
 [facility]  [facility]  PHYSICAL THERAPY INITIAL EVALUATION         Date:  07/07/2024   Patient: Emma Pena  DOB: [birth year]  MRN: [id number]  Referring Provider: McClung, Kevan, PA  [address]  [address]  [place],  [place] [address]     Medical Diagnosis:      Diagnosis Orders   1. Closed fracture of proximal end of left humerus, unspecified fracture morphology, initial encounter            Physician Order: Eval and Treat   Start passive of the shoulder around 7/22. NWB.     SUBJECTIVE:     Onset date: 06/11/2024    Onset: Sudden     History / Mechanism of Injury: Slip and fall, fractured left proximal humerus and 4 ribs.  Patient is right handed.    Interventions for current problem: sling for 4 weeks    Chief complaint: pain, decreased motion, decreased mobility, inability / limited ability to use arm, limited ability to complete ADLs (dressing , bathing, grooming, toileting ), limited ability to complete IADLs (cooking, cleaning, laundry), limited ability to lift/carry/handle material, limited ability to complete home/outdoor chores/tasks    Behavior: condition is getting better    Pain:   Pain 5-8/10  Current: 5/10     Best: 5/10     Worst:8/10 (occurs with movement).  Pain returns to baselineafter taking medication (oxycodone )  Pain frequency: constant    Symptom Type / Quality: aching  Location:: anterior, posterior, superior, A-C joint, inferior to acromion, all over        Aggravated by: reaching overhead, reaching out, reaching behind back, lifting/carrying/material handling, lying on left side    Relieved by: rest, placing arm in sling position , medication    Imaging results: XR Chest Morning Portable  Result Date: 06/13/2024  PORTABLE CHEST X-RAY HISTORY:  rib fractures, blunt chest trauma COMPARISON:  06/12/2024, 06/11/2024 TECHNIQUE: Portable single view of the chest. FINDINGS: The lungs are fully expanded and clear. The heart size is normal. The  mediastinal contours are normal. The known rib fractures are not well depicted. No pneumothorax. Partial visibility of LEFT proximal humeral fracture, obscured by artifact    IMPRESSION: No acute cardiopulmonary abnormality. LEFT proximal humeral fracture. LEFT proximal humeral fracture.    XR Humerus Left  Result Date: 06/13/2024  LEFT HUMERUS: HISTORY:  Pain after a fall COMPARISON: None TECHNIQUE: 2 views of the left humerus are obtained. FINDINGS: Internal rotation only views limit evaluation. Suspected nondisplaced fracture at the proximal humerus centered at the greater tuberosity. Shoulder and elbow alignment maintained.    IMPRESSION: Limited study, suspected proximal humeral fracture. Repeat imaging recommended    XR Chest Portable  Result Date: 06/12/2024  CHEST, LEFT SHOULDER, LEFT HUMERUS INDICATION: Fall, pain. Chest x-ray: Frontal projection. Diffuse osteopenia. Slight deformity of the humeral neck, corresponding to fracture, better visualized on shoulder radiograph. No other fracture evident. Cardiac silhouette and mediastinum within normal limits. Chronic appearing interstitial change. No infiltrate, effusion, or pneumothorax. Please see current chest CT report for additional details. Left shoulder, left humerus: 3 views, 2 views. Diffuse osteopenia. Acute, mildly displaced fracture at the left humeral head and neck. Normal alignment at the glenohumeral joint. Mild degenerative change at the [facility] joint. No other fracture evident at the humerus.    IMPRESSION: *  No evidence of active cardiopulmonary disease. *  Fracture at left humeral head and neck.    XR Shoulder  Left  Result Date: 06/12/2024  CHEST, LEFT SHOULDER, LEFT HUMERUS INDICATION: Fall, pain. Chest x-ray: Frontal projection. Diffuse osteopenia. Slight deformity of the humeral neck, corresponding to fracture, better visualized on shoulder radiograph. No other fracture evident. Cardiac silhouette and mediastinum within normal limits. Chronic  appearing interstitial change. No infiltrate, effusion, or pneumothorax. Please see current chest CT report for additional details. Left shoulder, left humerus: 3 views, 2 views. Diffuse osteopenia. Acute, mildly displaced fracture at the left humeral head and neck. Normal alignment at the glenohumeral joint. Mild degenerative change at the [facility] joint. No other fracture evident at the humerus.    IMPRESSION: *  No evidence of active cardiopulmonary disease. *  Fracture at left humeral head and neck.    XR Humerus Left  Result Date: 06/12/2024  CHEST, LEFT SHOULDER, LEFT HUMERUS INDICATION: Fall, pain. Chest x-ray: Frontal projection. Diffuse osteopenia. Slight deformity of the humeral neck, corresponding to fracture, better visualized on shoulder radiograph. No other fracture evident. Cardiac silhouette and mediastinum within normal limits. Chronic appearing interstitial change. No infiltrate, effusion, or pneumothorax. Please see current chest CT report for additional details. Left shoulder, left humerus: 3 views, 2 views. Diffuse osteopenia. Acute, mildly displaced fracture at the left humeral head and neck. Normal alignment at the glenohumeral joint. Mild degenerative change at the [facility] joint. No other fracture evident at the humerus.    IMPRESSION: *  No evidence of active cardiopulmonary disease. *  Fracture at left humeral head and neck.    CT CHEST ABDOMEN PELVIS W CONTRAST  Result Date: 06/11/2024  CT CHEST ABDOMEN AND PELVIS WITH CONTRAST HISTORY:  Fall with chest and abdominal pain. COMPARISON: None INTRAVENOUS CONTRAST:  100 mL of Omnipaque 300. TECHNIQUE: Contiguous CT axial imaging of the chest, abdomen and pelvis performed after administration of IV contrast. Coronal and sagittal reformats obtained. DICOM format image data available to non-affiliated external healthcare facilities or entities on a secure, media free, reciprocally searchable basis with patient authorization for at least a 12 month period  after the study. Dose reduction technique was used on this scan by utilizing automated exposure control, adjustment of the mA and/or kV according to the patient size. FINDINGS: Scout film: Unremarkable CHEST: Pulmonary:     There is no acute airspace disease or pulmonary contusion. No pneumothorax or pleural effusion. Mediastinum:   No acute vessel abnormality.  No mediastinal or hilar adenopathy. Heart size normal without pericardial effusion. Thoracic cage: Comminuted fracture of the left humeral head. Fracture extends through the greater tuberosity. Nondisplaced and minimally displaced left fourth, fifth, 6, 7 rib fractures. Diaphragm:  Intact ABDOMEN/PELVIS Abdominal Wall:  Unremarkable Hepatobiliary: No focal hepatic lesion or intrahepatic biliary dilatation. No acute parenchymal injury. Pancreas normal morphology and duct caliber. Gallbladder appears normal. Normal splenic parenchyma without acute injury. GU: Bilateral symmetrical renal parenchymal enhancement, without solid mass, hydronephrosis or nephrolithiasis or acute injury. Normal ureteral caliber. No intraluminal obstruction or calculus. Bladder appears normal. Normal adrenal morphology.   GI: No bowel obstruction or inflammatory bowel process. No free air or free fluid. Retroperitoneum: No retroperitoneal adenopathy.  No acute vessel abnormality. Musculoskeletal: No fracture or acute derangement.     IMPRESSION: 1.  Comminuted fracture of the left humeral head. 2.  Nondisplaced and minimally displaced left fourth through seventh rib fractures. No pneumothorax or pleural effusion. 3.  No acute abnormality in the abdomen or pelvis.    CT Spine Cervical Without IV Contrast  Result Date: 06/11/2024  CT BRAIN WITHOUT CONTRAST CT  CERVICAL SPINE WITHOUT CONTRAST HISTORY: Head and neck pain COMPARISON: None TECHNIQUE/PROTOCOL: Standard noncontrast brain performed plus standard noncontrast images through the cervical spine obtained with coronal and sagittal  reformats generated. DICOM format image data available to non-affiliated external healthcare facilities or entities on a secure, media free, reciprocally searchable basis with patient authorization for at least a 12 month period after the study. Dose reduction technique was used on this scan by utilizing automated exposure control, adjustment of the mA and/or kV according to the patient size. FINDINGS: Scout film: Unremarkable BRAIN FINDINGS: Parenchyma: No mass, midline shift, or edema.  Moderate decrease attenuation within the white matter suggestive of moderate chronic small vessel disease.  No intracranial hemorrhage or extraaxial fluid collection. Cerebellum and brainstem are without focal abnormality. Ventricular system:     Ventricles appear normal. Additional findings:     Calvarium intact. Orbits symmetric.  Sinuses and mastoid air cells are clear. CERVICAL SPINE FINDINGS: Vertebral bodies:   Alignment is maintained. No fracture or suspicious osseous lesion. Skull base:     Occipital condyles and skull base normal in appearance. Hardware:    None. Disc/degenerative findings:    Intervertebral disc spaces are maintained. Additional findings:    Regional soft tissues and lung apices unremarkable.     IMPRESSION: 1.  No acute intracranial abnormality. Moderate chronic small vessel disease. 2.  No acute fracture or malalignment of the cervical spine.    CT Head Without IV Contrast  Result Date: 06/11/2024  CT BRAIN WITHOUT CONTRAST CT CERVICAL SPINE WITHOUT CONTRAST HISTORY: Head and neck pain COMPARISON: None TECHNIQUE/PROTOCOL: Standard noncontrast brain performed plus standard noncontrast images through the cervical spine obtained with coronal and sagittal reformats generated. DICOM format image data available to non-affiliated external healthcare facilities or entities on a secure, media free, reciprocally searchable basis with patient authorization for at least a 12 month period after the study. Dose  reduction technique was used on this scan by utilizing automated exposure control, adjustment of the mA and/or kV according to the patient size. FINDINGS: Scout film: Unremarkable BRAIN FINDINGS: Parenchyma: No mass, midline shift, or edema.  Moderate decrease attenuation within the white matter suggestive of moderate chronic small vessel disease.  No intracranial hemorrhage or extraaxial fluid collection. Cerebellum and brainstem are without focal abnormality. Ventricular system:     Ventricles appear normal. Additional findings:     Calvarium intact. Orbits symmetric.  Sinuses and mastoid air cells are clear. CERVICAL SPINE FINDINGS: Vertebral bodies:   Alignment is maintained. No fracture or suspicious osseous lesion. Skull base:     Occipital condyles and skull base normal in appearance. Hardware:    None. Disc/degenerative findings:    Intervertebral disc spaces are maintained. Additional findings:    Regional soft tissues and lung apices unremarkable.     IMPRESSION: 1.  No acute intracranial abnormality. Moderate chronic small vessel disease. 2.  No acute fracture or malalignment of the cervical spine.      Past Medical History:  Past Medical History:   Diagnosis Date    Anxiety     Arthritis     GERD (gastroesophageal reflux disease)     Hypertension     Thyroid  disease      Past Surgical History:   Procedure Laterality Date    CHOLECYSTECTOMY      HYSTERECTOMY      THYROID  SURGERY Left 2000    TOTAL KNEE ARTHROPLASTY Left 02/06/2018    LEFT KNEE TOTAL ARTHROPLASTY (VISIONAIRE) performed by Elsie NOVAK  Milissa, DO at [facility] OR       Medications:   Current Outpatient Medications   Medication Sig Dispense Refill    ketorolac  (TORADOL ) 10 MG tablet Take 1 tablet by mouth every 6 hours as needed for Pain Patient received IV/IM dose of ketorolac  in ED without adverse effect 20 tablet 0    diclofenac  sodium (VOLTAREN ) 1 % GEL Apply 1 pump twice daily as directed Formula #1, OK to substitute 240 g 5    celecoxib   (CELEBREX ) 100 MG capsule Take 1 capsule by mouth 2 times daily for 14 days 28 capsule 0    gabapentin  (NEURONTIN ) 100 MG capsule Take 1 capsule by mouth 3 times daily for 14 days.. 42 capsule 0    allopurinol  (ZYLOPRIM ) 300 MG tablet TAKE 1 TABLET BY MOUTH EVERY DAY  at night  3    omeprazole (PRILOSEC) 20 MG delayed release capsule Take 20 mg by mouth nightly       metoprolol  (TOPROL -XL) 100 MG XL tablet Take 50 mg by mouth nightly       citalopram  (CELEXA ) 40 MG tablet Take 40 mg by mouth nightly       thyroid  (ARMOUR) 15 MG tablet Take 15 mg by mouth nightly       amLODIPine  (NORVASC ) 10 MG tablet Take 10 mg by mouth nightly        No current facility-administered medications for this visit.     Patient Goals: pain relief, full use of arm, get back to normal    Precautions/Contraindications: Script for passive ROM only.  Returns to physician in 2-3 weeks.    OBJECTIVE:     Estimated body mass index is 34.7 kg/m as calculated from the following:    Height as of 05/03/19: 1.676 m (5' 6).    Weight as of 04/25/21: 97.5 kg (215 lb).     Observations: Class 1 obesity (BMI of 30 to < 35)    Inspection: Left arm in sling position, very little active ROM and capacity to move left arm off abdomen.      Joint/Motion:      Shoulder:    Right Shoulder:  AROM: 180 Forward elevation,  90 ER,  IR to T12  PROM: 180 Forward elevation,  90 ER,  40 IR    Left Shoulder:  AROM: Not assessed due to passive phase of recovery  PROM: 95 Forward elevation (empty end-feel),  50 ER (empty end-feel) , 55 IR    Strength:  Right Shoulder: Flexion 5/5,  Abduction 5/5, ER 5/5, IR 5/5      Left Shoulder: Not assessed.    Palpation: Non-tender to palpation  .    Special Tests/Functional Screens:  None performed   []  Hawkins-Kennedy [] + / []  -  []  O'Brien's [] + / []  -   []   Yerganson's [] + / []  -    [] GH drawer [] + / []  -    []  Bicep Load [] + / []  -   []  Crank [] + / []  -  []  Allen [] + / []  -   []  Elbow Varus [] + / []  -  []  Neer's [] + / []  -       []  Speed's [] + / []  -   [] Sulcus Sign [] + / []  -   []  Apprehension [] + / []  -   []  Bicep Load II [] + / []  -   []  Roos [] + / []  -   []  Elbow Valgus [] + / []  -     []   Other: [] + / []  -         ASSESSMENT     Patient is 4 weeks post proximal humerus fracture (non-op).  Patient is limited in all directions along with impaired strength, function, weightbearing tolerance.  Treatment will start with PROM.  Called and left message with Provider to see if he wants us  to progress beyond PROM while she is here in [facility] for the next 3 weeks.  If he does want progression, treatment will also consist of AAROM, AROM, and wand exercises will begin.    Problems:   Pain 5-8/10 waxing / waning  ROM decreased  Strength decreased   Limitations with ADLs , IADLs , use of left upper extremity, reaching, lifting, carrying    [x]  There are no barriers affecting plan of care or recovery    []  Barriers to this patient's plan of care or recovery include:    Domestic Concerns:  [x]  No  []  Yes:    Long Term goals (3 weeks)  Pain 2-3/10  ROM 135 - 160 Forward elevation, 70 ER, 70 IR   Able to perform / complete the following functions / tasks: ADLs  Independent with Home Exercise Programs    Rehab Potential: [x]  Good  []  Fair  []  Poor    PLAN       Treatment Plan:   instruction in home exercise program   therapeutic exercise   therapeutic activity   neuromuscular re-education   manual therapy   cold / hot pack    The following CPT codes are likely to be used in the care of this patient:   97162 PT Evaluation: Moderate Complexity   97164 PT Re-Evaluation   97110 Therapeutic Exercise   97112 Neuromuscular Re-Education   97530 Therapeutic Activities   97140 Manual Therapy     Suggested Professional Referral: [x]  No  []  Yes:     Patient Education:  [x]  Plans / Goals, Risks / Benefits discussed  [x]  Home exercise program  Method of Education: [x]  Verbal  [x]  Demo  [x]  Written  Comprehension of Education:  [x]  Verbalizes understanding.  [x]   Demonstrates understanding.  []  Needs Review.  []  Demonstrates / verbalizes understanding of HEP / Ed previously given.    Frequency:  1-2 days per week for 3 weeks    Patient understands diagnosis/prognosis and consents to treatment, plan and goals: [x]  Yes    []  No     Thank you for the opportunity to work with your patient.  If you have questions or comments, please contact me at [phone]; fax: [phone].    Electronically signed by: Debby Kapur, PT    Medicare Patients Only     Please sign Physician's Certification and return to:  [facility] PHYSICIANS [facility] PHYSICAL THERAPY  [address]  [place] [address]  Dept: [phone]  Dept Fax: [phone]  Loc: [phone]    Physician's Certification / Comments     Frequency/Duration 1-2 days per week for 3 weeks.   Certification period from 07/07/2024  to 10/07/2024.    I have reviewed the Plan of Care established for skilled therapy services and certify that the services are required and that they will be provided while the patient is under my care.    Physician's Comments/Revisions:               Physician's Printed Name:  Physician's Signature:                                                               Date:

## 2024-07-07 NOTE — Telephone Encounter (Signed)
 Called Matsuko's provider office ([facility] in [place]) to request clarification of PT order; which states to start PROM of the shoulder.  Called to see if the provider Francenia Moores) wants us  to progress PROM to [facility] and AROM as tolerated.  Kevan will be given message to return my call.

## 2024-07-08 NOTE — Telephone Encounter (Signed)
 Dayle Moores, Faelyn's provider returned my call about potential progression.  He said some AAROM is ok, but PROM is what he wants most.  No full AROM.  No lifting.  His contact information is [phone]. Debby Kapur, PT

## 2024-07-11 ENCOUNTER — Encounter: Admit: 2024-07-11 | Payer: PRIVATE HEALTH INSURANCE | Attending: Rehabilitative and Restorative Service Providers"

## 2024-07-11 DIAGNOSIS — S42202A Unspecified fracture of upper end of left humerus, initial encounter for closed fracture: Principal | ICD-10-CM

## 2024-07-11 NOTE — Progress Notes (Signed)
 Physical Therapy Daily Treatment Note    Date: 07/11/2024  Patient Name: Emma Pena  Emma Pena  DOB: Nov 15, 1966   MRN: [id number]  DOInjury: 06/11/2024  DOSx: --   Referring Provider: McClung, Kevan, PA  [address]  [address]  [place],  [place] [address]     Medical Diagnosis:      Diagnosis Orders   1. Closed fracture of proximal end of left humerus, unspecified fracture morphology, initial encounter        Patient is 4 weeks post proximal humerus fracture (non-op).  Patient is limited in all directions along with impaired strength, function, weightbearing tolerance.  Treatment will start with PROM.  Called and left message with Provider to see if he wants us  to progress beyond PROM while she is here in [facility] for the next 3 weeks.  If he does want progression, treatment will also consist of AAROM, AROM, and wand exercises will begin.     Addendum 07/08/24: Emma Pena, Emma Pena's provider returned my call about potential progression.  He said some AAROM is ok, but PROM is what he wants most.  No full AROM.  No lifting.  His contact information is [phone]. Debby Kapur, PT     Precautions/Contraindications: Script for passive ROM only.  Phone call out to provider to see if they want progression beyond PROM.  Returns to physician in 2-3 weeks     Access Code: [email or web address]  URL: [email or web address]  Date: 07/11/2024  Prepared by: Debby Kapur    Exercises  - Circular Shoulder Pendulum with Table Support  - 2 x daily - 1 sets - 30 reps  - Supine Shoulder Press  - 2 x daily - 2-3 sets - 10 reps  - Supine Shoulder External Rotation in 45 Degrees Abduction AAROM with Dowel  - 2 x daily - 2-3 sets - 10 reps    X = TO BE PERFORMED NEXT VISIT  > = PROGRESS TO THIS FIRST  >> = PROGRESS TO THIS SECOND  >>> = PROGRESS TO THIS THIRD    S: Emma Pena's mom, Emma Pena, is present today for PROM instruction. Emma Pena reports 5/10 pain; waxing / waning; worse at night and in the afternoon.   O:    Time 8399-8354     Visit  2/3-6 Repeat outcome measure at mid point and end.    Pain Pain 5/10     ROM Right Shoulder:  AROM: 180 Forward elevation,  90 ER,  IR to T12  PROM: 180 Forward elevation,  90 ER,  40 IR     Left Shoulder:  AROM: Not assessed due to passive phase of recovery  PROM: 120 Forward elevation (empty end-feel),  50 ER (empty end-feel) , 55 IR     Modalities      Heat / Ice  prn  MO   Manual      Stretching all directions   MT   PROM all directions  X 30 min  Demo and return demo for HEP     Stretch      Table slides   TE   Wall Flexion   TE   Wall ER stretch   TE   IR stretch behind back   TE      TE   Exercise      Elbow AROM HEP     Hand, wrist, finger, forearm AROM HEP        TE   Pendulum Ex X 20  TE   Gear shift  exercise   TE   Pulleys - flex   TE   Pulleys-IR   TE   Supine wand chest press 2 x 10  TE   Supine wand flex X  TE   Supine wand ER/IR (arms at sides) 2 x 10  TE   Standing wand behind back IR >  TE   Supine flexion --  TE   S-lying ER --  TE   Standing wand flex >>  TE   Standing flexion --  TE   ROWS: H --  TA   ROWS: M --  TA   ROWS: L --  TA   ER (towel roll under arm) --  TE   IR (towel roll under arm) --  TE   Flexion in scapular plane --     Shelf Lift --     Shoulder press --     Biceps curl --     Triceps press down --                 A:  Tolerated well.  Emma Pena performed PROM well.  Written HEP updated.     P: Continue with rehab plan.  Will see 2 more times before she goes back to [place].     Debby Kapur, PT    Treatment Charges: Mins Units   Initial Evaluation     Re-Evaluation     Ther Exercise         TE 15 1   Manual Therapy     MT 30 2   Ther Activities        TA     Gait Training          GT     Neuro Re-education NR     Modalities     Non-Billable Service Time     Other     Total Time/Units 45 3

## 2024-07-18 ENCOUNTER — Encounter: Admit: 2024-07-18 | Discharge: 2024-07-18 | Payer: PRIVATE HEALTH INSURANCE

## 2024-07-18 DIAGNOSIS — S42202A Unspecified fracture of upper end of left humerus, initial encounter for closed fracture: Principal | ICD-10-CM

## 2024-07-18 NOTE — Progress Notes (Signed)
 Physical Therapy Daily Treatment Note    Date: 07/18/2024  Patient Name: Emma Pena  Emma Pena  DOB: [birth year]   MRN: [id number]  DOInjury: 06/11/2024  DOSx: --   Referring Provider: McClung, Kevan, PA  [address]  [address]  [place],  [place] [address]     Medical Diagnosis:      Diagnosis Orders   1. Closed fracture of proximal end of left humerus, unspecified fracture morphology, initial encounter        Patient is 4 weeks post proximal humerus fracture (non-op).  Patient is limited in all directions along with impaired strength, function, weightbearing tolerance.  Treatment will start with PROM.  Called and left message with Provider to see if he wants us  to progress beyond PROM while she is here in [facility] for the next 3 weeks.  If he does want progression, treatment will also consist of AAROM, AROM, and wand exercises will begin.     Addendum 07/08/24: Dayle Moores, Irisa's provider returned my call about potential progression.  He said some AAROM is ok, but PROM is what he wants most.  No full AROM.  No lifting.  His contact information is [phone]. Dick Jules, PTA     Precautions/Contraindications: Script for passive ROM only.  Phone call out to provider to see if they want progression beyond PROM.  Returns to physician in 2-3 weeks     Access Code: [email or web address]  URL: [email or web address]  Date: 07/11/2024  Prepared by: Debby Kapur    Exercises  - Circular Shoulder Pendulum with Table Support  - 2 x daily - 1 sets - 30 reps  - Supine Shoulder Press  - 2 x daily - 2-3 sets - 10 reps  - Supine Shoulder External Rotation in 45 Degrees Abduction AAROM with Dowel  - 2 x daily - 2-3 sets - 10 reps    X = TO BE PERFORMED NEXT VISIT  > = PROGRESS TO THIS FIRST  >> = PROGRESS TO THIS SECOND  >>> = PROGRESS TO THIS THIRD  S: pt reports compliance with given HEP .   O:    Time 0800-0830 am     Visit 3 /3-6 Repeat outcome measure at mid point and end.    Pain Pain 4/10 start   Pain 7/10 end      ROM Right  Shoulder:  AROM: 180 Forward elevation,  90 ER,  IR to T12  PROM: 180 Forward elevation,  90 ER,  40 IR     Left Shoulder:  AROM: Not assessed due to passive phase of recovery  PROM: 120 Forward elevation (empty end-feel),  50 ER (empty end-feel) , 55 IR     Modalities      Heat / Ice  prn  MO   Manual      Stretching all directions   MT   PROM all directions  X 30 min  Demo and return demo for HEP     Stretch      Table slides   TE   Wall Flexion   TE   Wall ER stretch   TE   IR stretch behind back   TE      TE   Exercise      Elbow AROM HEP     Hand, wrist, finger, forearm AROM HEP        TE   Pendulum Ex X 20  TE   Gear shift exercise   TE   Pulleys - flex  TE   Pulleys-IR   TE   Supine wand chest press 2 x 10  TE   Supine wand flex 2 X 10 new   TE   Supine wand ER/IR (arms at sides) 2 x 10  TE   Standing wand behind back IR >  TE   Supine flexion --  TE   S-lying ER --  TE   Standing wand flex >>  TE   Standing flexion --  TE   ROWS: H --  TA   ROWS: M --  TA   ROWS: L --  TA   ER (towel roll under arm) --  TE   IR (towel roll under arm) --  TE   Flexion in scapular plane --     Shelf Lift --     Shoulder press --     Biceps curl --     Triceps press down --                 A:  Tolerated well.   Pt able to tolerate all previously given ex' along with newly added exercise  as listed . Written HEP updated.     P: Continue with rehab plan.  Will see 1  more time before she goes back to [place].     Dick Jules, PTA    Treatment Charges: Mins Units   Initial Evaluation     Re-Evaluation     Ther Exercise         TE 15 1   Manual Therapy     MT 15 1   Ther Activities        TA     Gait Training          GT     Neuro Re-education NR     Modalities     Non-Billable Service Time     Other     Total Time/Units 30 2

## 2024-07-25 ENCOUNTER — Encounter: Admit: 2024-07-25 | Discharge: 2024-07-25 | Payer: PRIVATE HEALTH INSURANCE

## 2024-07-25 DIAGNOSIS — S42202A Unspecified fracture of upper end of left humerus, initial encounter for closed fracture: Principal | ICD-10-CM

## 2024-07-25 NOTE — Progress Notes (Signed)
 Physical Therapy Daily Treatment Note    Date: 07/25/2024  Patient Name: Emma Pena  Ra-Shell  DOB: 1966-12-01   MRN: [id number]  DOInjury: 06/11/2024  DOSx: --   Referring Provider: McClung, Kevan, PA  [address]  [address]  [place],  [place] [address]     Medical Diagnosis:      Diagnosis Orders   1. Closed fracture of proximal end of left humerus, unspecified fracture morphology, initial encounter        Patient is 4 weeks post proximal humerus fracture (non-op).  Patient is limited in all directions along with impaired strength, function, weightbearing tolerance.  Treatment will start with PROM.  Called and left message with Provider to see if he wants us  to progress beyond PROM while she is here in [facility] for the next 3 weeks.  If he does want progression, treatment will also consist of AAROM, AROM, and wand exercises will begin.     Addendum 07/08/24: Dayle Moores, Kaeley's provider returned my call about potential progression.  He said some AAROM is ok, but PROM is what he wants most.  No full AROM.  No lifting.  His contact information is [phone]. Dick Jules, PTA     Precautions/Contraindications: Script for passive ROM only.  Phone call out to provider to see if they want progression beyond PROM.  Returns to physician in 2-3 weeks     Access Code: [email or web address]  URL: [email or web address]  Date: 07/11/2024  Prepared by: Debby Kapur    Exercises  - Circular Shoulder Pendulum with Table Support  - 2 x daily - 1 sets - 30 reps  - Supine Shoulder Press  - 2 x daily - 2-3 sets - 10 reps  - Supine Shoulder External Rotation in 45 Degrees Abduction AAROM with Dowel  - 2 x daily - 2-3 sets - 10 reps    X = TO BE PERFORMED NEXT VISIT  > = PROGRESS TO THIS FIRST  >> = PROGRESS TO THIS SECOND  >>> = PROGRESS TO THIS THIRD  S: pt reports still feeling pretty good .   O:    Time 0800-0830 am     Visit 4 /Repeat outcome measure at mid point and end.    Pain Pain 3/10 start   Pain 7/10 end      ROM Right  Shoulder:  AROM: 180 Forward elevation,  90 ER,  IR to T12  PROM: 180 Forward elevation,  90 ER,  40 IR     Left Shoulder:  AROM: Not assessed due to passive phase of recovery  PROM: 120 Forward elevation (empty end-feel),  50 ER (empty end-feel) , 55 IR     Modalities      Heat / Ice  prn  MO   Manual      Stretching all directions   MT   PROM all directions  X 15 min  Demo and return demo for HEP     Stretch      Table slides   TE   Wall Flexion   TE   Wall ER stretch   TE   IR stretch behind back   TE      TE   Exercise      Elbow AROM HEP     Hand, wrist, finger, forearm AROM HEP        TE   Pendulum Ex X 20  TE   Gear shift exercise   TE   Pulleys - flex  TE   Pulleys-IR   TE   Supine wand chest press 2 x 10  TE   Supine wand flex 2 X 10  TE   Supine wand ER/IR (arms at sides) 2 x 10  TE   Standing wand behind back IR >  TE   Supine flexion --  TE   S-lying ER --  TE   Standing wand flex >>  TE   Standing flexion --  TE   ROWS: H --  TA   ROWS: M --  TA   ROWS: L --  TA   ER (towel roll under arm) --  TE   IR (towel roll under arm) --  TE   Flexion in scapular plane --     Shelf Lift --     Shoulder press --     Biceps curl --     Triceps press down --                 A:  Tolerated well.   Pt able to tolerate all  ex' along with  manual stretches   as listed . Written HEP updated.     P: Continue with rehab plan.  Will see    Last visit pt going to return to home out of town. Pt to continue with given HEP and transfer to physical therapy at home.  Dick Jules, PTA    Treatment Charges: Mins Units   Initial Evaluation     Re-Evaluation     Ther Exercise         TE 15 1   Manual Therapy     MT 15 1   Ther Activities        TA     Gait Training          GT     Neuro Re-education NR     Modalities     Non-Billable Service Time     Other     Total Time/Units 30 2

## 2024-08-25 ENCOUNTER — Institutional Professional Consult (permissible substitution): Admitting: Family Medicine

## 2024-08-26 ENCOUNTER — Telehealth (INDEPENDENT_AMBULATORY_CARE_PROVIDER_SITE_OTHER): Payer: Self-pay | Admitting: Family Medicine

## 2024-08-26 NOTE — Telephone Encounter (Signed)
 New Patient Script Check List Patient Acknowledges Each Item Below:  []   There is a one-time $99.00 Program Fee.  []   You must have a Body Mass Index (BMI) of 30 or higher to qualify for our program.  []   Once you become a New Patient and schedule your first appointment, you are responsible for completing our New Patient Packet.  []   Prior to the first appointment, you will be required to fast for eight hours  []   You must also drink plenty of water  that day before and morning of your new patient appointment.  []   You will also have an electrocardiogram (EKG) during your New Patient visit  do not apply any lotions or creams prior to the appointment.   []   You will also have a metabolic breathing test known as the Indirect Calorimetry (IC) Test  []   We are a Specialty office, but we bill as a Primary Care. You will have the same co-payments and co-insurances as you would at your primary care provider's office.  []   We have a 5-minute grace period. If you arrive later than this time, we will need to reschedule your appointment to a later time or date.  []   There is a $50 fee if the initial consultation visit, New Patient Appointment, or First Follow-up Appointment is a no-show, so please make sure to pick a time that works with your schedule.   []   We recommend that all our patients sign up for and utilize [employer].

## 2024-10-10 ENCOUNTER — Other Ambulatory Visit (HOSPITAL_BASED_OUTPATIENT_CLINIC_OR_DEPARTMENT_OTHER): Payer: Self-pay | Admitting: Registered Nurse

## 2024-10-10 ENCOUNTER — Ambulatory Visit (HOSPITAL_BASED_OUTPATIENT_CLINIC_OR_DEPARTMENT_OTHER)
Admission: RE | Admit: 2024-10-10 | Discharge: 2024-10-10 | Disposition: A | Discharge status: Home / Self Care | Source: Ambulatory Visit | Attending: Registered Nurse | Admitting: Registered Nurse

## 2024-10-10 DIAGNOSIS — M545 Low back pain, unspecified: Secondary | ICD-10-CM | POA: Insufficient documentation

## 2024-10-10 DIAGNOSIS — R350 Frequency of micturition: Secondary | ICD-10-CM | POA: Diagnosis present

## 2024-10-10 DIAGNOSIS — R3129 Other microscopic hematuria: Secondary | ICD-10-CM

## 2024-10-10 DIAGNOSIS — R1024 Suprapubic pain: Secondary | ICD-10-CM | POA: Diagnosis present
